# Patient Record
Sex: Male | Born: 1955 | Race: White | Hispanic: No | Marital: Married | Smoking: Current every day smoker
Health system: Southern US, Community
[De-identification: ages and names within clinical notes are randomized; demographics above are authoritative.]

## PROBLEM LIST (undated history)

## (undated) DIAGNOSIS — M4714 Other spondylosis with myelopathy, thoracic region: Secondary | ICD-10-CM

## (undated) DIAGNOSIS — IMO0002 Reserved for concepts with insufficient information to code with codable children: Secondary | ICD-10-CM

## (undated) DIAGNOSIS — J189 Pneumonia, unspecified organism: Secondary | ICD-10-CM

## (undated) DIAGNOSIS — K219 Gastro-esophageal reflux disease without esophagitis: Secondary | ICD-10-CM

## (undated) DIAGNOSIS — E119 Type 2 diabetes mellitus without complications: Secondary | ICD-10-CM

## (undated) DIAGNOSIS — G4730 Sleep apnea, unspecified: Secondary | ICD-10-CM

## (undated) DIAGNOSIS — G259 Extrapyramidal and movement disorder, unspecified: Secondary | ICD-10-CM

## (undated) DIAGNOSIS — R32 Unspecified urinary incontinence: Secondary | ICD-10-CM

## (undated) DIAGNOSIS — R011 Cardiac murmur, unspecified: Secondary | ICD-10-CM

## (undated) DIAGNOSIS — J449 Chronic obstructive pulmonary disease, unspecified: Secondary | ICD-10-CM

## (undated) DIAGNOSIS — E7800 Pure hypercholesterolemia, unspecified: Secondary | ICD-10-CM

## (undated) DIAGNOSIS — H5440 Blindness, one eye, unspecified eye: Secondary | ICD-10-CM

## (undated) DIAGNOSIS — R0602 Shortness of breath: Secondary | ICD-10-CM

## (undated) DIAGNOSIS — G4700 Insomnia, unspecified: Secondary | ICD-10-CM

## (undated) DIAGNOSIS — I10 Essential (primary) hypertension: Secondary | ICD-10-CM

## (undated) DIAGNOSIS — I509 Heart failure, unspecified: Secondary | ICD-10-CM

## (undated) HISTORY — DX: Reserved for concepts with insufficient information to code with codable children: IMO0002

## (undated) HISTORY — DX: Heart failure, unspecified: I50.9

## (undated) HISTORY — PX: CARDIAC CATHETERIZATION: SHX172

## (undated) HISTORY — PX: EYE SURGERY: SHX253

## (undated) HISTORY — DX: Insomnia, unspecified: G47.00

## (undated) HISTORY — DX: Morbid (severe) obesity due to excess calories: E66.01

## (undated) HISTORY — PX: CATARACT EXTRACTION, BILATERAL: SHX1313

## (undated) HISTORY — PX: OTHER SURGICAL HISTORY: SHX169

## (undated) HISTORY — DX: Chronic obstructive pulmonary disease, unspecified: J44.9

## (undated) HISTORY — DX: Extrapyramidal and movement disorder, unspecified: G25.9

## (undated) HISTORY — PX: CARPAL TUNNEL RELEASE: SHX101

## (undated) HISTORY — DX: Unspecified urinary incontinence: R32

## (undated) HISTORY — DX: Other spondylosis with myelopathy, thoracic region: M47.14

---

## 1997-10-09 ENCOUNTER — Ambulatory Visit (HOSPITAL_COMMUNITY): Admission: RE | Admit: 1997-10-09 | Discharge: 1997-10-10 | Payer: Self-pay | Admitting: Pediatrics

## 1997-11-20 ENCOUNTER — Inpatient Hospital Stay (HOSPITAL_COMMUNITY): Admission: RE | Admit: 1997-11-20 | Discharge: 1997-11-21 | Payer: Self-pay | Admitting: Pediatrics

## 1998-05-28 ENCOUNTER — Ambulatory Visit (HOSPITAL_COMMUNITY): Admission: RE | Admit: 1998-05-28 | Discharge: 1998-05-28 | Payer: Self-pay | Admitting: Neurosurgery

## 1998-06-01 ENCOUNTER — Ambulatory Visit (HOSPITAL_COMMUNITY): Admission: RE | Admit: 1998-06-01 | Discharge: 1998-06-02 | Payer: Self-pay | Admitting: Neurosurgery

## 1998-07-11 ENCOUNTER — Inpatient Hospital Stay (HOSPITAL_COMMUNITY): Admission: EM | Admit: 1998-07-11 | Discharge: 1998-07-11 | Payer: Self-pay | Admitting: Neurosurgery

## 1998-07-16 ENCOUNTER — Encounter: Payer: Self-pay | Admitting: Pediatrics

## 1998-07-16 ENCOUNTER — Inpatient Hospital Stay (HOSPITAL_COMMUNITY): Admission: RE | Admit: 1998-07-16 | Discharge: 1998-07-17 | Payer: Self-pay | Admitting: Neurosurgery

## 1998-09-01 ENCOUNTER — Inpatient Hospital Stay (HOSPITAL_COMMUNITY): Admission: AD | Admit: 1998-09-01 | Discharge: 1998-09-07 | Payer: Self-pay | Admitting: Neurosurgery

## 1999-05-17 ENCOUNTER — Ambulatory Visit (HOSPITAL_COMMUNITY): Admission: RE | Admit: 1999-05-17 | Discharge: 1999-05-17 | Payer: Self-pay | Admitting: Pediatrics

## 1999-05-31 ENCOUNTER — Encounter: Payer: Self-pay | Admitting: Pediatrics

## 1999-05-31 ENCOUNTER — Ambulatory Visit (HOSPITAL_COMMUNITY): Admission: RE | Admit: 1999-05-31 | Discharge: 1999-05-31 | Payer: Self-pay | Admitting: Pediatrics

## 2000-03-20 ENCOUNTER — Encounter: Payer: Self-pay | Admitting: Pediatrics

## 2000-03-20 ENCOUNTER — Encounter: Admission: RE | Admit: 2000-03-20 | Discharge: 2000-03-20 | Payer: Self-pay | Admitting: Pediatrics

## 2000-04-04 ENCOUNTER — Inpatient Hospital Stay (HOSPITAL_COMMUNITY): Admission: RE | Admit: 2000-04-04 | Discharge: 2000-04-06 | Payer: Self-pay | Admitting: Neurosurgery

## 2001-03-19 ENCOUNTER — Encounter: Admission: RE | Admit: 2001-03-19 | Discharge: 2001-03-19 | Payer: Self-pay | Admitting: Pediatrics

## 2001-03-19 ENCOUNTER — Encounter: Payer: Self-pay | Admitting: Pediatrics

## 2001-04-05 ENCOUNTER — Inpatient Hospital Stay (HOSPITAL_COMMUNITY): Admission: AD | Admit: 2001-04-05 | Discharge: 2001-04-07 | Payer: Self-pay | Admitting: Neurosurgery

## 2001-04-27 ENCOUNTER — Encounter: Admission: RE | Admit: 2001-04-27 | Discharge: 2001-04-27 | Payer: Self-pay | Admitting: Neurosurgery

## 2001-06-26 ENCOUNTER — Encounter: Admission: RE | Admit: 2001-06-26 | Discharge: 2001-06-26 | Payer: Self-pay | Admitting: Pediatrics

## 2001-06-26 ENCOUNTER — Encounter: Payer: Self-pay | Admitting: Pediatrics

## 2001-07-06 ENCOUNTER — Ambulatory Visit (HOSPITAL_COMMUNITY): Admission: RE | Admit: 2001-07-06 | Discharge: 2001-07-06 | Payer: Self-pay | Admitting: Neurosurgery

## 2002-01-16 ENCOUNTER — Encounter: Admission: RE | Admit: 2002-01-16 | Discharge: 2002-01-16 | Payer: Self-pay | Admitting: Pediatrics

## 2002-01-16 ENCOUNTER — Encounter: Payer: Self-pay | Admitting: Pediatrics

## 2002-12-19 ENCOUNTER — Encounter: Payer: Self-pay | Admitting: Pediatrics

## 2002-12-19 ENCOUNTER — Encounter: Admission: RE | Admit: 2002-12-19 | Discharge: 2002-12-19 | Payer: Self-pay | Admitting: Pediatrics

## 2003-02-26 ENCOUNTER — Emergency Department (HOSPITAL_COMMUNITY): Admission: EM | Admit: 2003-02-26 | Discharge: 2003-02-26 | Payer: Self-pay | Admitting: Emergency Medicine

## 2003-12-19 ENCOUNTER — Other Ambulatory Visit: Payer: Self-pay

## 2004-04-29 ENCOUNTER — Ambulatory Visit: Payer: Self-pay | Admitting: Internal Medicine

## 2004-05-28 ENCOUNTER — Emergency Department (HOSPITAL_COMMUNITY): Admission: EM | Admit: 2004-05-28 | Discharge: 2004-05-28 | Payer: Self-pay | Admitting: Emergency Medicine

## 2004-07-07 ENCOUNTER — Encounter: Admission: RE | Admit: 2004-07-07 | Discharge: 2004-07-07 | Payer: Self-pay | Admitting: Pediatrics

## 2004-08-04 ENCOUNTER — Encounter: Payer: Self-pay | Admitting: Pediatrics

## 2004-08-25 ENCOUNTER — Encounter: Payer: Self-pay | Admitting: Pediatrics

## 2004-09-30 ENCOUNTER — Ambulatory Visit: Payer: Self-pay | Admitting: Internal Medicine

## 2004-11-02 ENCOUNTER — Ambulatory Visit: Payer: Self-pay | Admitting: Specialist

## 2005-01-03 ENCOUNTER — Observation Stay (HOSPITAL_COMMUNITY): Admission: RE | Admit: 2005-01-03 | Discharge: 2005-01-03 | Payer: Self-pay | Admitting: Neurosurgery

## 2005-02-18 ENCOUNTER — Inpatient Hospital Stay (HOSPITAL_COMMUNITY): Admission: AD | Admit: 2005-02-18 | Discharge: 2005-02-19 | Payer: Self-pay | Admitting: Neurosurgery

## 2005-04-18 ENCOUNTER — Inpatient Hospital Stay (HOSPITAL_COMMUNITY): Admission: RE | Admit: 2005-04-18 | Discharge: 2005-04-18 | Payer: Self-pay | Admitting: Neurosurgery

## 2005-06-23 ENCOUNTER — Ambulatory Visit (HOSPITAL_COMMUNITY): Admission: RE | Admit: 2005-06-23 | Discharge: 2005-06-23 | Payer: Self-pay | Admitting: Orthopedic Surgery

## 2005-06-23 ENCOUNTER — Ambulatory Visit (HOSPITAL_BASED_OUTPATIENT_CLINIC_OR_DEPARTMENT_OTHER): Admission: RE | Admit: 2005-06-23 | Discharge: 2005-06-23 | Payer: Self-pay | Admitting: Orthopedic Surgery

## 2005-10-31 ENCOUNTER — Ambulatory Visit: Payer: Self-pay

## 2005-11-03 ENCOUNTER — Ambulatory Visit: Payer: Self-pay

## 2006-08-18 ENCOUNTER — Ambulatory Visit: Payer: Self-pay | Admitting: Internal Medicine

## 2007-08-29 ENCOUNTER — Ambulatory Visit: Payer: Self-pay | Admitting: Family Medicine

## 2008-02-27 ENCOUNTER — Observation Stay (HOSPITAL_COMMUNITY): Admission: RE | Admit: 2008-02-27 | Discharge: 2008-02-28 | Payer: Self-pay | Admitting: Neurosurgery

## 2008-05-15 ENCOUNTER — Ambulatory Visit: Payer: Self-pay | Admitting: Internal Medicine

## 2008-12-24 ENCOUNTER — Ambulatory Visit (HOSPITAL_COMMUNITY): Admission: RE | Admit: 2008-12-24 | Discharge: 2008-12-24 | Payer: Self-pay | Admitting: Pediatrics

## 2009-02-20 ENCOUNTER — Ambulatory Visit: Payer: Self-pay | Admitting: Internal Medicine

## 2010-01-19 ENCOUNTER — Ambulatory Visit: Payer: Self-pay | Admitting: Internal Medicine

## 2010-11-09 NOTE — Op Note (Signed)
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  He has had a baclofen pump placed, which helps quite a bit  when it is working well.  He has had multiple baclofen pump revisions.  The patient has become more spastic and developed a leg pain.  He, on  working with x-rays that demonstrated it looks like another baclofen  pump catheter fracture, and we discussed a revision of his baclofen  pump.  The patient also requests to have some old retained intrathecal  catheters removed as well.  I discussed the surgery with him, the risks,  benefits, and alternatives and he desired to proceed with the surgery.   PREOPERATIVE DIAGNOSIS:  Baclofen pump malfunction; retained intrathecal  catheters.   POSTOPERATIVE DIAGNOSIS:  Baclofen pump malfunction; retained  intrathecal catheters.   PROCEDURES:  Baclofen pump revision (replacement of intrathecal  catheter); L4 laminectomy and L3 laminotomies for removal of retained  intrathecal catheters using microdissection.   SURGEON:  Cristi Loron, MD   ASSISTANT:  Danae Orleans. Venetia Maxon, MD   ANESTHESIA:  General endotracheal.   ESTIMATED BLOOD LOSS:  100 mL.   SPECIMENS:  None.   DRAINS:  None.   COMPLICATIONS:  None.   SURGICAL PROCEDURE:  The patient was brought to the operating room by  the anesthesia team.  General endotracheal anesthesia was induced.  The  patient initially was turned on the lateral position with his right side  down.  His left abdomen, flank, and lumbar region was then shaved with  clippers and then prepared with Betadine scrub and  Betadine solution.  Sterile drapes were applied.  I then injected the area to be incised  with Marcaine with epinephrine solution.  I used a scalpel to make an  incision through the patient's previous left upper quadrant abdominal  incision over the baclofen pump.  We used electrocautery to expose the  baclofen pump.  There was a small seroma around pump.  No evidence of  infection.  I removed the pump from the subcutaneous pocket.  We then  used a 15 blade scalpel to carefully remove the silk ties from the  connector that connected the catheter down to the pump.  We then used a  syringe to aspirate through the intrathecal catheter.  We could not  express any CSF fluid indicative of malfunction.  We then removed the  catheter.   We now turned attention to placing a new intrathecal catheter.  I  incised the L2-3 interspace with a scalpel exposing the underlying  fascia.  I under fluoroscopic guidance cannulated the L2-3 interspace  with a Tuohy needle and removed the stylet and then threaded a  intrathecal catheter through the Tuohy needle.  We then removed the  guidewire and we had free spontaneous flow of cerebrospinal fluid  through the proximal end of the thecal catheter.  We then used a shunt  passer to pass the catheter from the lumbar incision into the abdominal  incision.  We then shortened the catheter by 2.5 cm.  We then connected  the fascia and connected the thecal catheter to the baclofen pump and  secured all the connections appropriately with the sutureless  connectors.  We then used a needle aspirate to the aspiration port  through the baclofen pump and we freely expressed 34 mL of cerebrospinal  fluid, indicative  pump's patency.  We placed the pump in the  subcutaneous pocket and then secured the sock to the subcutaneous  tissue using interrupted #1 Vicryl sutures.  We then reapproximated the  patient's abdominal incision with interrupted 2-0 Vicryl suture in   subcutaneous tissue, and Steri-Strips and Benzoin on the skin.  We then  placed the fastener in the lumbar incision and sutured to the fascia,  then reapproximated the patient's lumbar subcutaneous tissue with  interrupted 2-0 Vicryl suture and the skin with Steri-Strips and  Benzoin.   Having completed the revision of baclofen pump, we now turned attention  to the laminectomy to remove the retained intrathecal catheters.  The  patient was then turned to prone position on the [place] frame.  His  lumbar region was then prepared with Betadine scrub and Betadine  solution.  Sterile drapes were applied.  I injected the area to be  incised with Marcaine with epinephrine solution.  I used a scalpel to  make a linear midline incision over the L3-4 and L4-5 interspaces.  I  used electrocautery to perform a bilateral subperiosteal resection  exposing spinous process lamina of L3 and L4.  We obtained  intraoperative radiograph to confirm our location.  I then incised the  L3-4 and L4-5 interspinous ligament with the scalpel and then used  Leksell rongeur to remove the spinous process of L4 and caudal spinous  process of L3.  We then used a high-speed drill to perform bilateral L3-  L4 laminotomies.  I then completed the L4 laminectomy with a Kerrison  punch, widened the L3 laminotomies and removed the L3-4 and L4-5  ligamentum flavum exposing the thecal sac.  We then brought the  operative microscope into field and under speculation illumination, we  completed the microdissection/decompression.  I used a a 15 blade  scalpel to perform a midline durotomy.  We tacked the dural edges up  using 4-0 Nurolon sutures.  I then incised the underlying arachnoid  membrane and released CSF from the thecal sac.  We then used  microdissection to dissect in the cauda equina and we found the expected  4 intrathecal catheter which were easily removed.  We then irrigated the  subarachnoid space out with saline  solution and then reapproximated the  patient's dura with a running 6-0 Prolene suture.  We then had  anesthesia Valsalva with the patient.  There was a watertight seal, then  was placed Tisseel over the exposed dura to decrease chance of CSF  fistula.  We obtained hemostasis using bipolar cautery, irrigated the  wound out with bacitracin solution, removed the retractors, and then  reapproximated the patient's thoracolumbar fascia with interrupted #1  Vicryl suture.  The subcutaneous tissue with interrupted 2-0 Vicryl  suture and the skin with Steri-Strips and Benzoin.  The wound was then  coated with bacitracin ointment.  Sterile dressing was applied.  The  drapes were removed.  The patient was subsequently returned to supine  position where he was extubated by the anesthesia team and transported  to the Postanesthesia Care Unit in stable condition.  All sponge,  instrument, and needle counts were correct at the end of this case.      Cristi Loron, M.D.  Electronically Signed     JDJ/MEDQ  D:  02/27/2008  T:  02/28/2008  Job:  [id number]

## 2010-11-12 NOTE — Op Note (Signed)
[place]. [facility]  Patient:    [name], [name] Visit Number: [id number] MRN: [id number]          Service Type: Attending:  Cristi Loron, M.D. Dictated by:   Cristi Loron, M.D.   CC:         Deanna Artis. Sharene Skeans, M.D.   Operative Report  PREOPERATIVE DIAGNOSIS:  Possible Baclofen pump malfunction/fracture, fractured catheter.  POSTOPERATIVE DIAGNOSIS:  Functioning catheter.  PROCEDURE:  Aspiration of Baclofen pump tubing contents and injection of Omnipaque dye into the catheter and subsequently into the thecal sac.  SURGEON:  Cristi Loron, M.D.  ANESTHESIA:  Local.  BRIEF HISTORY:  The patient is a white male for whom I had placed a Baclofen pump for spastic paraparesis several years ago.  He has had a couple of fracture malfunctions/catheter fractures.  He felt increasing spasticity, and therefore we got some x-rays that demonstrated no evidence of catheter fracture, but in order to make sure that the catheter was indeed patent, the patient decided to proceed with a Baclofen pump aspiration injection.  DESCRIPTION OF PROCEDURE:  The patient was brought to the fluoroscopy suite. His abdomen was then prepared with Betadine solution.  Sterile drapes were applied.  I injected 1% lidocaine solution over the area of the Baclofen pump access port.  Under fluoroscopic guidance I used a 25-gauge spinal needle to access the lateral catheter access port.  I aspirated 10 cc of clear fluid and then injected approximately 3 cc of Omnipaque 180 under fluoroscopic guidance.  It demonstrated the dye flowing freely into the thecal sac, demonstrating the patency of the catheter.  The drapes were removed.  A bandage was placed over the injection site.  The patient was subsequently referred to the day hospital for observation. Dictated by:   Cristi Loron, M.D. Attending:  Cristi Loron, M.D. DD:  07/06/01 TD:  07/06/01 Job:  [id number]

## 2010-11-12 NOTE — Op Note (Signed)
NAMEHENNING, EHLE            ACCOUNT NO.:  [id number]   MEDICAL RECORD NO.:  [id number]          PATIENT TYPE:  INP   LOCATION:  2899                         FACILITY:  [facility]   PHYSICIAN:  Cristi Loron, M.D.DATE OF BIRTH:  [birth year]   DATE OF PROCEDURE:  04/18/2005  DATE OF DISCHARGE:                                 OPERATIVE REPORT   INDICATIONS FOR PROCEDURE:  The patient is a 55 year old white male who has  spastic paraparesis of unknown etiology.  He has had a baclofen pump placed,  with good relief of his symptoms.  He has had multiple revisions.  Most  recently, the patient has complained that the pump seems to be moving in his  abdomen, sliding down.  At this point, the pump is somewhat superficial.  We discussed the various treatment options, including observation versus  revision of the pump and moving it to the opposite side, i.e., the left  abdomen.  The patient weighed the risks, benefits, and alternatives of  surgery and has decided to proceed with the operation.   PREOPERATIVE DIAGNOSIS:  Baclofen pump movement.   POSTOPERATIVE DIAGNOSIS:  Baclofen pump movement.   PROCEDURE:  Revision of baclofen pump (moving the baclofen pump to the left  abdomen).   SURGEON:  Cristi Loron, M.D.   ASSISTANT:  None.   ANESTHESIA:  General endotracheal.   ESTIMATED BLOOD LOSS:  Minimal.   SPECIMENS:  Cultures of peripump fluid.   DRAINS:  None.   COMPLICATIONS:  None.   DESCRIPTION OF PROCEDURE:  The patient was brought to the operating room by  the anesthesia team.  General endotracheal anesthesia was induced.  The  patient remained in the supine position.  His abdomen was shaved and  prepared with Betadine scrub and Betadine solution.   In then injected the area to be incised with Marcaine with epinephrine  solution.  I used the scalpel to make an incision over the patient's  previous right abdomen scar over his pump.  I used electrocautery to  dissect  deeper.  I used the Barrister's clerk for exposure.  We exposed the pump.  There was some benign-appearing fluid around the pump which was yellow and  clear.  We obtained some cultures to be complete.  We then removed the  baclofen pump out of the abdominal pocket.  We disconnected the silk ties  which were securing the pump catheter to the pump and disconnected the  catheter.  There was good and spontaneous backflow of fluid through the pump  catheter.  We then made an incision in the patient's left abdomen just below  the ribs and used electrocautery to create a subcutaneous pocket.  We then  used a shunt passer to pass a 24-cm new piece of shunt tubing from the left  incision to the right incision.  We then used a connector to connect the old  catheter to the new catheter extension (the old catheter was 81 cm, the new  catheter was 24 cm, making the total length of 105 cm).  We secured the  connection with silk ties, and then we  used the TB syringe to withdraw 1 cc  of baclofen/cerebrospinal fluid from the catheter, and there continued to be  free and spontaneous flow of CSF backwards through the catheter.  We then  connected the catheter to the new pump connector and connected it to the  pump.  We used silk ties to secure the connections.  We placed a mesh  stocking over the baclofen pump and placed it in the new subcutaneous pocket  created in the left abdomen.  We then irrigated the wounds out and then  reapproximated the subcutaneous tissue with interrupted 2-0 Vicryl sutures  and the skin with Steri-Strips and benzoin.  The wounds were then coated  with bacitracin ointment.  Sterile dressings were applied.  The drapes were  removed.   The patient was subsequently extubated by the anesthesia team and  transported to the postanesthesia care unit in stable condition.  All  sponge, instrument, and needle counts were correct at the end of this case.   Prior to transporting  the patient to the postanesthesia care unit, the pump  was programmed to give a 462 mcg bolus of a 2000 concentration of baclofen  to flush out the 105 cm of pump tubing.      Cristi Loron, M.D.  Electronically Signed     JDJ/MEDQ  D:  04/18/2005  T:  04/18/2005  Job:  [id number]

## 2010-11-12 NOTE — Discharge Summary (Signed)
[place]. [facility]  Patient:    [name], [name] Visit Number: [id number] MRN: [id number]          Service Type: MED Location: 3000 [id number] [id number] Attending Physician:  Cristi Loron Dictated by:   Tanya Nones. Jeral Fruit, M.D. Admit Date:  04/04/2001 Discharge Date: 04/07/2001                             Discharge Summary  ADMISSION DIAGNOSIS:  Baclofen pump malfunction.  DISCHARGE DIAGNOSIS:  Baclofen pump malfunction.  CLINICAL HISTORY:  The patient was admitted by Dr. Lovell Sheehan because of malfunction of the baclofen pump.  The patient has a spastic paraparesis and he had been treated by the neurologist.  Because of malfunction of the shunt, he was admitted for revision.  LABORATORIES:  Normal.  COURSE IN THE HOSPITAL:  The patient was taken to surgery and had revision of the shunt done by Dr. Lovell Sheehan.  It was found that there was fracture of the catheter and a new catheter was replaced.  Today, the patient is feeling much better and he wants to go home.  CONDITION ON DISCHARGE:  Improved.  MEDICATION:  Percocet for pain.  DIET:  Regular.  ACTIVITY:  As before surgery.  FOLLOWUP:  He is to call Dr. Lovell Sheehan for a followup appointment.Dictated by: Charlton Haws. Jeral Fruit, M.D. Attending Physician:  Tressie Stalker D DD:  04/07/01 TD:  04/08/01 Job: [id number]

## 2010-11-12 NOTE — H&P (Signed)
[place]. [facility]  Patient:    [name], [name]                   MRN: [id number] Adm. Date:  [id number] Disc. Date: [id number] Attending:  Tressie Stalker [name]                         History and Physical  CHIEF COMPLAINT:  "Baclofen pump is broken."  HISTORY OF PRESENT ILLNESS:  The patient is a 55 year old white male who has a spastic paraparesis of unknown etiology and has been worked up by Dr. Deanna Artis. [name] for this.  He underwent insertion of a Baclofen pump by me with an intrathecal catheter, first on Nov 20, 1997.  He subsequently underwent a revision of his distal catheter in December of 1999 and suffered another fracture in January of 2000, requiring another revision.  He has done well since January 2000, until approximately three weeks ago, when he was golfing, he swung at the ball, missed, spun around and fell to the ground and since then, has had some increasing spasticity.  He saw Dr. Sharene [name] who worked him up with some x-rays that demonstrated that this intrathecal catheter had fractured and Dr. Sharene [name] kindly sent him back for my consultation.  I discussed the situation with him.  The patient weighed the risks, benefits and alternatives to surgery and decided to proceed with a Baclofen pump revision.  PAST MEDICAL HISTORY:  Past medical history is positive, as above, for spastic paraparesis of unknown etiology, glaucoma and placement of a Baclofen pump.  PAST SURGICAL HISTORY:  Status post insertion of Baclofen pump, Nov 20, 1997, revision of fracture catheter on June 01, 1998 and July 10, 1998.  MEDICATIONS PRIOR TO ADMISSION 1. Prevacid 30 mg p.o. q.[name]. 2. Vioxx 30 mg p.o. q.[name]. 3. Baclofen 60 mg p.o. q.[name]. 4. Timoptic-XE eye drops, one drop OS q.[name].  DRUG ALLERGIES:  DIAMOX.  FAMILY MEDICAL HISTORY:  Noncontributory.  SOCIAL HISTORY:  Patient smoke one-half pack per day of cigarettes.  He is married.  He is  working.  REVIEW OF SYSTEMS:  Negative except as above.  PHYSICAL EXAMINATION  GENERAL:  A pleasant 55 year old white male with a spastic paraparesis who walks with a cane.  HEENT:  Normocephalic.  He has anisocoria (longstanding).  Oropharynx benign.  NECK:  Supple.  No masses or deformities, meningismus, etc.  THORAX:  Symmetric.  LUNGS:  Clear to auscultation.  HEART:  Regular rate and rhythm.  ABDOMEN:  Soft and nontender.  His incision for his Baclofen pump has healed well.  The pump region is nontender.  BACK:  His lumbar incision has healed well.  There are no masses, discharge, infection, etc.  NEUROLOGIC:  The patient is alert and oriented x 3.  Cranial nerve exam is grossly normal except for his above-mentioned anisocoria.  He has some weakness, i.e., 4/5 in his right psoas and quadriceps; otherwise, his strength is relatively normal.  Sensation is grossly normal.  IMAGING STUDIES:  The patient had lumbar x-rays performed at [facility] on March 20, 2000 which demonstrates the patients Baclofen pump intrathecal catheter has fractured.  ASSESSMENT AND PLAN 1. Baclofen pump catheter fracture.  I discussed the situation with patient    and reviewed his x-rays with him and pointed out the abnormalities.  I    discussed the risks and treatment options including going back on oral    Baclofen and not doing  any more surgery versus a revision of his catheter.    He has been through this before.  I re-explained the procedure and its    risks.  The patient weighed the risks, benefits and alternatives to surgery    and decided to proceed with the operation. 2. History of glaucoma noted. DD:  04/03/00 TD:  04/03/00 Job: [id number]

## 2010-11-12 NOTE — Op Note (Signed)
[place]. [facility]  Patient:    [name], [name]                   MRN: [id number] Proc. Date: 04/03/00 Adm. Date:  [id number] Attending:  Cristi Loron CC:         Deanna Artis. Sharene Skeans, M.D.   Operative Report  BRIEF HISTORY:  The patient is a 55 year old white male who has a spastic paraparesis of unknown etiology.  He has been treated with a baclofen pump. He has had two prior catheter fractures and approximately 2-3 weeks fell and noticed some increasing spasticity after that and subsequently was diagnosed with another catheter fracture.  I discussed the various treatment options with him including surgery.  The patient weighed the risks, benefits and alternatives of surgery and decided to have his pump revised.  PREOPERATIVE DIAGNOSIS:  [employer] baclofen pump catheter fracture.  POSTOPERATIVE DIAGNOSIS:  [employer] baclofen pump catheter fracture.  PROCEDURE:  Revision of [employer] baclofen catheter (replacement of catheter).  SURGEON:  Cristi Loron, M.D.  ASSISTANT:  None.  ANESTHESIA:  General endotracheal anesthesia.  ESTIMATED BLOOD LOSS:  Minimal.  SPECIMENS:  None.  DRAINS:  None.  COMPLICATIONS:  None.  DESCRIPTION OF PROCEDURE:  The patient was brought to the operating room by the anesthesia team.  General endotracheal anesthesia was induced.  He was then turned onto his left side with his right flank up.  His right abdomen flank and back was then shaved and prepared with Betadine scrub and Betadine solution and sterile drapes were applied.  I then injected the areas to be incised with Marcaine with epinephrine solution.  I used a scalpel to make a midline incision in the lower lumbar region i.e. through his previous surgical site.  I used electrocautery to dissect down to the old catheter.  I removed as much of the catheter as I could (prior to the catheter had fractured and was within the spinal canal and was not  readily accessible and I had previously discussed with IM leaving this catheter in place and I did this, I left it in place).  I then put a pursestring suture around the tract of the old catheter.  There was no spinal fluid leaking.  I then used a Tuohy needle to cannulate the patients thecal sac at the L4-5 interspace.  I removed the stylet and there was good free flow of cerebrospinal fluid.  I then passed a new intrathecal catheter through the Tuohy needle and then under fluoroscopic guidance up to the T10-11 intervertebral disk.  I then removed the Tuohy needle over the catheter and then removed the guidewire from the catheter.  I then turned my attention to the abdominal wound.  I incised through the previous abdominal incision and then used electrocautery to expose where the old catheter connected with the baclofen pump.  I used the scalpel to remove the silk tie sutures which were securing the old catheter in place and then I removed the old catheter and the connector.  I then used a shunt passer to pass the new intrathecal catheter from the lumbar incision to the abdominal incision.  I then shortened the new catheter and connected it to the baclofen pump via the connector and two, 0 Silk ties.  I left a little extra catheter in place.  I left some slack in the system.  I then copiously irrigated the abdominal wound with bacitracin solution, removed solution, and then reapproximated the patients abdominal  subcutaneous tissue with interrupted 2-0 Vicryl and the skin with Steri-Strips and benzoin.  The wounds were then covered with bacitracin ointment.  I then placed a connector at the entrance site of the thecal catheter into the spine and secured it with 2-0 Vicryl sutures.  I then irrigated the lumbar incision with bacitracin solution and then reapproximated the subcutaneous tissue in this area with interrupted 2-0 Vicryl suture and the skin with a running 3-0 nylon suture.  Of  note, throughout the procedure there was good free flow of cerebrospinal fluid through the distal end of the intrathecal catheter obviously up to the point where it I connected it to the pump.  All sponge, needle and instrument counts were correct at the end of this case. A sterile dressing was placed on both incisions and drapes were removed and patient was subsequently returned to the supine position and was extubated by the anesthesia team and transported to the post anesthesia care unit in stable condition.  We did not bolus the pump.  We kept it running at the patients normal basal rate. DD:  04/03/00 TD:  04/04/00 Job: [id number]

## 2010-11-12 NOTE — Op Note (Signed)
[name], [name]            ACCOUNT NO.:  [id number]   MEDICAL RECORD NO.:  [id number]          PATIENT TYPE:  AMB   LOCATION:  DSC                          FACILITY:  [facility]   PHYSICIAN:  Katy Fitch. Sypher, M.D. DATE OF BIRTH:  01/27/1956   DATE OF PROCEDURE:  06/23/2005  DATE OF DISCHARGE:                                 OPERATIVE REPORT   PREOPERATIVE DIAGNOSIS:  Chronic left carpal tunnel syndrome.   POSTOPERATIVE DIAGNOSIS:  Chronic left carpal tunnel syndrome.   OPERATIONS:  Release of left transverse carpal ligament.   OPERATIONS:  Katy Fitch. Sypher, M.D.   ASSISTANT:  Molly Maduro Dasnoit PA-C.   ANESTHESIA:  General by LMA, supervising anesthesiologist is Dr. Gelene Mink.   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  He has had a chronic degenerative  neurologic illness for 25 years.  He ambulates with a cane and other  assistive devices.  He has had a number of pain management strategies  utilized by his neurologist as well as a baclofen pump.  Recently he  developed increasing pain and numbness in his hands.  He was diagnosed by  Dr. Sharene Skeans to have carpal tunnel syndrome.  Electrodiagnostic studies  revealed moderately severe right carpal tunnel syndrome.   Due to a failure to respond to nonoperative measures, he is brought to the  operating at this time for release of his left transverse carpal ligament.   PROCEDURE:  [name] was brought to the operating room and placed in  supine position on the operating table.   Following the induction of general anesthesia by LMA technique, the left arm  was prepped with Betadine soap and solution and sterilely draped.   Following exsanguination of the limb with an Esmarch bandage, the arterial  tourniquet was inflated to 220 mmHg.   The procedure commenced with a short incision in the line of the ring finger  in the palm.  The subcutaneous tissues  were carefully divided to reveal the  palmar fascia.  This was split longitudinally to reveal the common sensory  branch of the median nerve.   These were followed back to the transverse carpal ligament, which was gently  isolated from median nerve.   The ligament released along its ulnar border extending into the distal  forearm.   This widely opened the carpal canal.  No mass or other predicaments were  noted.   Bleeding points along the margin of this ligament were cauterized with  bipolar current, followed by repair of the skin with intradermal 3-0 Prolene  suture.   A compressive dressing was applied with a volar plaster splint maintaining  the wrist in 5 degrees of dorsiflexion.   For aftercare [name] was given a prescription for Percocet 5/325 mg  one p.o. q.4-6h. p.r.n. pain, 20 tablets without refill.   Katy Fitch Sypher, M.D.  Electronically Signed     RVS/MEDQ  D:  06/23/2005  T:  06/24/2005  Job:  [id number]   cc:   Deanna Artis. Sharene Skeans, M.D.  Fax: [phone]

## 2010-11-12 NOTE — Op Note (Signed)
[name], [name]            ACCOUNT NO.:  [id number]   MEDICAL RECORD NO.:  [id number]          PATIENT TYPE:  INP   LOCATION:  3010                         FACILITY:  [facility]   PHYSICIAN:  [name], M.D.DATE OF BIRTH:  1956-04-19   DATE OF PROCEDURE:  02/18/2005  DATE OF DISCHARGE:  02/19/2005                                 OPERATIVE REPORT   BRIEF HISTORY:  The patient is a 55 year old white male who I performed a  revision of his baclofen pump secondary to a [employer] pump recall back on  January 03, 2005.  The patient did well postoperatively.  In fact, I saw him on  August 22 in a routine postoperative followup.  At that time he was doing  great, was having no trouble with his pump.  He suddenly took a fall and  said he felt the pump shift.  He manually pushed the shunt back up to what  he thought was a normal position and since then has had increasing  spasticity, he developed a headache and noticed some fluid around the shunt.  I saw the patient in the office today and evaluated with some shunt x-rays  and there was a question of a disconnection of the shunt.  I discussed the  various treatment options with the patient and his wife and recommended that  he undergo an exploration of his pump connections and the possible baclofen  pump revision.  The patient and his wife weighed the risks, benefits and  alternatives of surgery and decided to proceed with the operation.   PREOPERATIVE DIAGNOSIS:  Baclofen pump malfunction/disconnection.   POSTOPERATIVE DIAGNOSIS:  The baclofen pump is working fine and all  connections were secure.   PROCEDURE:  Exploration and testing of baclofen pump.   SURGEON:  Dr. Delma [name].   ASSISTANT:  None.   ANESTHESIA:  General endotracheal.   ESTIMATED BLOOD LOSS:  Minimal.   SPECIMENS:  None.   DRAINS:  None.   COMPLICATIONS:  None.   DESCRIPTION OF PROCEDURE:  The patient was brought to the operating room by  the  anesthesia team.  General endotracheal anesthesia was induced.  The  patient was turned to the lateral position with the right side up, exposing  his right abdomen.  His right abdomen, flank and his lumbar region was then  prepared with Betadine scrub and Betadine solution, sterile drapes were  applied.  I then used the scalpel to incise the patient's right lower  quadrant abdominal incision.  I used electrocautery to expose the caudal  aspect of the pump as well as the connection of the pump to the thecal  catheter.  I inspected the connections, they were all secure.  Because there  was a question of the functioning of the pump, I cut the silk tie suture  which was holding the connector to the pump and then carefully noted that  there was free and spontaneous flow of baclofen back through the catheter.  I then used a syringe to aspirate approximately 8 mL of baclofen followed by  cerebral spinal fluid, demonstrating the patency of the thecal  catheter.  We  then reconnected the connector to the pump and then secured it with a 0 silk  tie.  The wound was then copiously irrigated out with bacitracin solution.  Then the subcutaneous tissue was then reapproximated with a 2-0 Vicryl  suture and the skin was Steri-stripped with benzoin.  The wound was then  coated with bacitracin ointment, a sterile dressing was applied, the drapes  were removed and the patient was subsequently extubated by the anesthesia  team and transported to the Post Anesthesia Care Unit in stable condition.  All sponge, instrument and needle counts were correct at the end of this  case.   After the operation, we used the computer telemetry to query the pump.  It  seemed to be functioning well at its intended settings.  Because we had  flushed all of the baclofen out of the thecal catheter, we programmed the  pump to give a 340 mg, i.e. 0.17 mL bolus of baclofen to flush the cerebral  spinal fluid out and then we otherwise  kept the pump at its original  settings.   The patient tolerated this procedure well and was transported to the Post  Anesthesia Care Unit in stable condition.      [name], M.D.  Electronically Signed     JDJ/MEDQ  D:  02/18/2005  T:  02/20/2005  Job:  [id number]

## 2010-11-12 NOTE — Discharge Summary (Signed)
NAMECAILLOU, MINUS            ACCOUNT NO.:  [id number]   MEDICAL RECORD NO.:  [id number]          PATIENT TYPE:  AMB   LOCATION:  DSC                          FACILITY:  [facility]   PHYSICIAN:  Cristi Loron, M.D.DATE OF BIRTH:  10-24-1955   DATE OF ADMISSION:  06/23/2005  DATE OF DISCHARGE:  06/23/2005                                 DISCHARGE SUMMARY   BRIEF HISTORY:  The patient is a 55 year old white male who has spastic  paresis of unknown etiology,  he has had a baclofen pump place with relief  of symptoms.  He has had multiple revisions.  Most recently the patient  complains the pump seems to be moving around in his abdomen.  At this point  the pump seems to be somewhat superficial.  We discussed the various  treatment options including observation of the pump versus revision i.e.  moving it to the opposite side.  The patient's weighed the risks benefits  and alternatives to surgery, and decided to proceed with the operation.   For further details of this admission please refer to the typed history and  physical.   HOSPITAL COURSE:  On the day of admission I performed a revision of the  patient's baclofen pump.  I moved the pump from the right advanced to the  right abdomen.  The surgery went well.  For full details of this operation  please refer to the type operative note.   Postoperative Hospital Course:  The patient's postoperative course was  unremarkable.  The patient was discharged on the evening of surgery.  At  that he was afebrile, vital signs were stable, he was eating well and  ambulating well.  The patient was therefore discharged to home.   DISCHARGE DIAGNOSIS:  Baclofen pump, superficial and mobile.   OPERATION:  Revision of baclofen pump.      Cristi Loron, M.D.  Electronically Signed     JDJ/MEDQ  D:  06/30/2005  T:  07/01/2005  Job:  [id number]

## 2010-11-12 NOTE — Op Note (Signed)
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  He has undergone  placement of a Baclofen pump with several revisions of the catheter.  He has  done well for the last couple years, and the patient's Baclofen pump has  been recalled, and he has decided to have the pump replaced after weighing  the risks, benefits, and alternatives of surgery.   PREOPERATIVE DIAGNOSIS:  Baclofen pump recall.   POSTOPERATIVE DIAGNOSIS:  Baclofen pump recall.   PROCEDURE:  Replacement of Baclofen pump.   SURGEON:  Cristi Loron, M.D.   ASSISTANT:  None.   ANESTHESIA:  General endotracheal.   ESTIMATED BLOOD LOSS:  Minimal.   SPECIMENS:  None.   DRAINS:  None.   COMPLICATIONS:  None.   DESCRIPTION OF PROCEDURE:  The patient was brought to the operating room by  the anesthesia team.  General endotracheal anesthesia was induced.  The  patient was placed in the lateral position on an axillary roll with his  right side up.  His abdominal and thoracic region was then prepared with  Betadine scrub and Betadine solution.  Sterile drapes were applied.  I then  used a scalpel to make a linear incision in the patient's right lower  quadrant of his abdomen just below the Baclofen pump.  I used electrocautery  to expose the thecal catheter.  We then used electrocautery to expose the  Baclofen pump that we removed.  We externalized it.  We then prefilled the  new pump with 200 mcg/cc concentrated Baclofen, and then we purged the  system so that the Baclofen was  dripping from the end of the pump.  We then  disconnected the old suture tie which was holding the thecal catheter to the  old pump and then disconnected it.  We carefully observed that the fluid was  emanating from the catheter, indicative of a patent catheter.  We did not  let any of the medicine drip out.  We then connected the old catheter to the  new pump and then secured it with a 0 silk tie.  We then placed the new pump  back in the old sock which had previously contained the old pump.  We then  obtained stringent hemostasis using the bipolar cautery.  We then  reapproximated the patient's subcutaneous tissue with interrupted #1 Vicryl  suture, the skin with interrupted 2-0  Vicryl suture, and the superficial  skin with Steri-Strips and Benzoin.  The wound was then coated with  bacitracin ointment.  A sterile  dressing was applied.  The drapes were removed, and the patient was  subsequently extubated by the anesthesia team and transported  to the  postanesthesia care unit in stable condition.  All sponge, instrument, and  needle counts were correct at the end of this case.      Cristi Loron, M.D.  Electronically Signed     JDJ/MEDQ  D:  01/03/2005  T:  01/04/2005  Job:  [id number]

## 2010-11-12 NOTE — Op Note (Signed)
[place]  Patient:    [name], [name] Visit Number: [id number] MRN: [id number]          Service Type: DSU Location: 3000 3030 01 Attending Physician:  Cristi Loron Dictated by:   Cristi Loron, M.D. Proc. Date: 04/04/01 Admit Date:  04/04/2001                             Operative Report  PREOPERATIVE DIAGNOSIS:  Baclofen pump malfunction (catheter fracture).  POSTOPERATIVE DIAGNOSIS:  Baclofen pump malfunction (catheter fracture).  PROCEDURE:   ______ pump revision (placement of new intrathecal catheter, 31.5 cm long).  SURGEON:  Cristi Loron, M.D.  ASSISTANT:  None.  ANESTHESIA:  General endotracheal.  ESTIMATED BLOOD LOSS:  Less than 100 cc.  SPECIMENS:  None.  DRAINS:  None.  COMPLICATIONS:  None.  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  He had a back pump put in a couple of years ago.  He has done very well with it.  Presents with catheter fractures and began having increasing spasticity with headaches a couple weeks ago, and was found to have another catheter fracture.  I discussed the treatment options including doing nothing, medical management, and Baclofen pump revision.  The patient weighed the risks, benefits, and alternatives of surgery, and decided to have the pump revised.  DESCRIPTION OF PROCEDURE:  The patient was brought to the operating room by the anesthesia team.  General endotracheal anesthesia was induced.  The patient was then turned to the lateral position with the right side up, and his right abdomen, flank, and lumbar region was then shaved, prepped, and scrubbed with Betadine solution.  Sterile drapes were applied.  I injected the areas to be incised with Marcaine with epinephrine solution.  I made a paramedian incision at L4-5 on the right and used electrocautery to dissect down to the old catheter.  I then  inserted a Tuohy needle into the L4-5 interspace under fluoroscopic guidance.  I got good ________.  I then attempted to place the catheter.  It would not thread and I had to pull the catheter bag out and replace the Tuohy needle under fluoroscopic guidance and ________ and then the catheter threaded fairly easily.  I put the tip of the catheter at approximately T10-11 under fluoroscopic guidance, and then removed the guidewire from the catheter, and removed the Tuohy needle.  There was good flow of CSF through the catheter.  I then made an incision over the patients Baclofen pump in the right abdominal region.  I used electrocautery to expose the connection of the old catheter to the Baclofen pump.  I used the 15 blade scalpel to cut the silk ties and then I removed the fastener and the old catheter.  I then used a shunt passer to pass the intrathecal catheter from the lumbar incision to the abdominal incision.  I then cut off 3.5 cm of catheter and used the appropriate connectors to connect the catheter to the back of the pump and secure the connections with 0 silk ties.  I then placed the right angle fastener in the patients lumbar region to fasten the catheter down.  Again, I secured the silk ties and secured the fastener down to the fascia using Vicryl suture.  I should note that we checked the patency of the catheter right before I connected it  to the pump and there was good flow of CSF distally through the catheter.  I then copiously irrigated the wound with Bacitracin solution and achieved strenuous hemostasis with electrocautery.  I then reapproximated the patients subcutaneous tissue with interrupted 2-0 Vicryl suture.  I reapproximated the skin over the patients lumbar incision with a running 3-0 nylon suture, and the skin at the abdominal incision with Steri-Strips and Benzoin.  The wounds were then coated with Bacitracin ointment.  A sterile dressing applied and drapes  removed.  The patient was subsequently returned to the supine position where he was extubated by the anesthesia team and transferred to the postanesthesia care unit in stable condition.  All sponge, instrument, and needle counts were correct at the end of the case. Dictated by:   Cristi Loron, M.D. Attending Physician:  Tressie Stalker D DD:  04/04/01 TD:  04/04/01 Job: [id number]

## 2010-11-12 NOTE — Discharge Summary (Signed)
[place]  Patient:    [name], [name]                   MRN: [id number] Adm. Date:  [id number] Disc. Date: [id number] Attending:  Cristi Loron CC:         Deanna Artis. Sharene Skeans, M.D.   Discharge Summary  For full details of this admission, please refer to the typed history and physical.  BRIEF HISTORY:  The patient is a 55 year old white male who has a spastic paraparesis of unknown etiology.  He has been treated with Baclofen pump.  He has had two prior catheter fractures.  Approximately three weeks prior to this admission, he fell and noticed some increasing spasticity.  Subsequently he was diagnosed by Deanna Artis. Hickling, M.D., with another catheter fracture.  I discussed the various treatment options with him, including surgery.  The patient weighed the risks, benefits, and alternatives of surgery and decided to proceed with a pump revision.  For past medical history, past surgical history, medications prior to admission, drug allergies, family medical history, social history, admission history and physical exam, imaging studies, assessment and plan, etc., please refer to the typed history and physical.  HOSPITAL COURSE:  I performed a revision of a [employer] Baclofen pump catheter (replacement of the catheter) on April 03, 2000, without complications.  For full details of this operation, please refer to the typed operative note.  The patients postoperative course was essentially unremarkable.  I kept him flat for 24 hours to reduce the chance of developing a pseudomeningocele.  He did have some leg pain after the surgery, but it resolved.  By postoperative day #2, i.e., April 06, 2000, the patient was afebrile, the vital signs were stable, he was eating well, and ambulating well.  His wound was healing well without signs of infection.  He was requesting discharge home.  I therefore discharged him home on April 06, 2000.  DISCHARGE INSTRUCTIONS:  The patient was instructed to follow up with me in one week for suture removal.  He was given written discharge instructions.  DISCHARGE MEDICATIONS: 1. Percocet 5 mg #60 one to two p.o. q.4h. p.r.n. for pain, no refills. 2. Valium 5 mg #40 one p.o. q.6h. p.r.n. for muscle spasm, one refill.  FINAL DIAGNOSIS:  Baclofen pump malfunction (thecal catheter fracture).  PROCEDURES PERFORMED:  Revision of Baclofen pump (catheter replacement). DD:  04/27/00 TD:  04/27/00 Job: [id number]

## 2010-12-30 ENCOUNTER — Ambulatory Visit: Payer: Self-pay

## 2011-07-07 ENCOUNTER — Institutional Professional Consult (permissible substitution): Payer: Self-pay | Admitting: Cardiology

## 2011-07-27 ENCOUNTER — Institutional Professional Consult (permissible substitution): Payer: Self-pay | Admitting: Cardiology

## 2011-08-04 ENCOUNTER — Ambulatory Visit (INDEPENDENT_AMBULATORY_CARE_PROVIDER_SITE_OTHER): Payer: Managed Care, Other (non HMO) | Admitting: Cardiology

## 2011-08-04 ENCOUNTER — Encounter: Payer: Self-pay | Admitting: Cardiology

## 2011-08-04 ENCOUNTER — Encounter: Payer: Self-pay | Admitting: *Deleted

## 2011-08-04 DIAGNOSIS — I509 Heart failure, unspecified: Secondary | ICD-10-CM

## 2011-08-04 DIAGNOSIS — Z720 Tobacco use: Secondary | ICD-10-CM

## 2011-08-04 DIAGNOSIS — Z01810 Encounter for preprocedural cardiovascular examination: Secondary | ICD-10-CM

## 2011-08-04 DIAGNOSIS — I5030 Unspecified diastolic (congestive) heart failure: Secondary | ICD-10-CM

## 2011-08-04 DIAGNOSIS — F17200 Nicotine dependence, unspecified, uncomplicated: Secondary | ICD-10-CM

## 2011-08-04 NOTE — Assessment & Plan Note (Signed)
He says he quit smoking this morning. I encourage continued abstinence and applauded his effort.

## 2011-08-04 NOTE — Assessment & Plan Note (Signed)
I would like to review the data from [facility] prior to make a comment on his preoperative risk. However, I suspect he has some diastolic dysfunction and would likely be at acceptable risk for the planned procedure. We will try to get information from [facility] as quickly as possible.

## 2011-08-04 NOTE — Assessment & Plan Note (Signed)
I will check an echocardiogram and a BNP level. We discussed salt and fluid restriction.

## 2011-08-04 NOTE — Progress Notes (Signed)
HPI The patient presents for preoperative evaluation prior to getting a baclofen pump replace. He has this for treatment of spastic paralysis. Prior to this surgery he was sent here because of a past history of heart failure. He said he had dyspnea and was referred to [facility]. There he had a heart catheterization last year which she reports was normal. I don't have these results. This was apparently a radial catheterization and I don't suspect there were right heart pressures. He does report he had an echocardiogram and was told that his heart was "stiff". He was never told that he had a weak heart. No change in therapy or further evaluation was suggested. He does have chronic dyspnea. However, he has long-standing cigarette abuse and because of his chronic neurologic problems is very sedentary. With transfer from his wheelchair to his car he does get short of breath and tachycardic. He'll get dyspneic with this and some chest tightness. However, this is unchanged over the past many months since the time of his evaluation at [facility]. He denies any PND or orthopnea. He denies any palpitations, presyncope or syncope. He has chronic left lower extremity edema with a recent diagnosis of venous stasis.  Allergies  Allergen Reactions  . Diamox (Acetazolamide) Rash    Current Outpatient Prescriptions  Medication Sig Dispense Refill  . BACLOFEN IT Continuous intrathecal infusion pump      . DULoxetine (CYMBALTA) 60 MG capsule Take 60 mg by mouth daily.      . Fluticasone-Salmeterol (ADVAIR) 250-50 MCG/DOSE AEPB Inhale 1 puff into the lungs 2 (two) times daily.      . furosemide (LASIX) 20 MG tablet Take 20 mg by mouth daily.      Marland Kitchen gemfibrozil (LOPID) 600 MG tablet Take 600 mg by mouth daily.      Marland Kitchen lisinopril-hydrochlorothiazide (PRINZIDE,ZESTORETIC) 20-25 MG per tablet Take 1 tablet by mouth daily.      Marland Kitchen LORazepam (ATIVAN) 2 MG tablet Take 2 mg by mouth every 8 (eight) hours as needed.      . multivitamin  (THERAGRAN) per tablet Take 2 tablets by mouth daily.      Marland Kitchen oxyCODONE-acetaminophen (PERCOCET) 10-325 MG per tablet Take 1 tablet by mouth every 8 (eight) hours as needed.      Marland Kitchen rOPINIRole (REQUIP) 1 MG tablet Take 1 mg by mouth daily.      Marland Kitchen tiotropium (SPIRIVA) 18 MCG inhalation capsule Place 18 mcg into inhaler and inhale daily.        Past Medical History  Diagnosis Date  . CHF (congestive heart failure)   . COPD (chronic obstructive pulmonary disease)   . Thoracic myelopathy   . Incontinence   . Insomnia   . Morbid obesity   . Spastic paraparesis     Past Surgical History  Procedure Date  . Baclofen infusion pump   . Eye surgery     Family History  Problem Relation Age of Onset  . Cancer Father 30  . Emphysema Mother 69    History   Social History  . Marital Status: Married    Spouse Name: N/A    Number of Children: N/A  . Years of Education: N/A   Occupational History  . Not on file.   Social History Main Topics  . Smoking status: Current Everyday Smoker -- 4.0 packs/day for 38 years    Types: Cigarettes  . Smokeless tobacco: Not on file   Comment: Quit recent.  . Alcohol Use: Not on file  .  Drug Use: Not on file  . Sexually Active: Not on file   Other Topics Concern  . Not on file   Social History Narrative   Lives with wife.  No children.    ROS:  Positive for reflux, urinary frequency and nocturia, joint pains and swelling, constipation, reflux.  Otherwise as stated in the HPI and negative for all other systems.  PHYSICAL EXAM BP 140/79  Pulse 78  Ht 5\' 7"  (1.702 m)  Wt 255 lb (115.667 kg)  BMI 39.94 kg/m2 PHYSICAL EXAM GEN:  No distress NECK:  No jugular venous distention at 90 degrees, waveform within normal limits, carotid upstroke brisk and symmetric, no bruits, no thyromegaly, edentulous LYMPHATICS:  No cervical adenopathy LUNGS:  Clear to auscultation bilaterally BACK:  No CVA tenderness CHEST:  Unremarkable HEART:  S1 and S2  within normal limits, no S3, no S4, no clicks, no rubs, no murmurs ABD:  Positive bowel sounds normal in frequency in pitch, no bruits, no rebound, no guarding, unable to assess midline mass or bruit with the patient seated, obese EXT:  2 plus pulses throughout, moderate edema in the left leg, no cyanosis no clubbing SKIN:  No rashes no nodules NEURO:  Cranial nerves II through XII grossly intact, lower extremity weakness and muscle wasting PSYCH:  Cognitively intact, oriented to person place and time  EKG:  Sinus rhythm, rate 78, left axis deviation, left anterior fascicular block, poor anterior R wave progression.  08/04/2011  ASSESSMENT AND PLAN

## 2011-08-04 NOTE — Assessment & Plan Note (Signed)
Weight loss of difficult with his neurologic problems. He needs calorie restriction.

## 2011-08-04 NOTE — Patient Instructions (Signed)
Please have blood work today. (BNP)  The current medical regimen is effective;  continue present plan and medications.  Your physician has requested that you have an echocardiogram. Echocardiography is a painless test that uses sound waves to create images of your heart. It provides your doctor with information about the size and shape of your heart and how well your heart's chambers and valves are working. This procedure takes approximately one hour. There are no restrictions for this procedure.  Follow up as needed after testing

## 2011-08-05 ENCOUNTER — Telehealth: Payer: Self-pay | Admitting: Cardiology

## 2011-08-05 LAB — BRAIN NATRIURETIC PEPTIDE: Pro B Natriuretic peptide (BNP): 18 pg/mL (ref 0.0–100.0)

## 2011-08-05 NOTE — Telephone Encounter (Signed)
Error

## 2011-08-08 ENCOUNTER — Telehealth: Payer: Self-pay | Admitting: Cardiology

## 2011-08-08 NOTE — Telephone Encounter (Signed)
ROI faxed to [facility] @ [phone] 08/08/11/km

## 2011-08-09 NOTE — Telephone Encounter (Signed)
ROI Refaxed to [facility]  08/09/11/KM

## 2011-08-12 ENCOUNTER — Encounter: Payer: Self-pay | Admitting: Cardiology

## 2011-08-12 NOTE — Telephone Encounter (Signed)
I have reviewed this gentleman's records from [place]. He had cardiac catheterization in August. He had insignificant coronary disease. There was some mild pulmonary hypertension. He did have scattered 20% and 30% lesions in his coronaries. According to ACC/AHA guidelines the patient is at acceptable risk for the planned procedure.

## 2011-08-12 NOTE — Telephone Encounter (Signed)
Records rec from [facility] gave to [facility] 08/12/11/KM

## 2011-08-19 ENCOUNTER — Other Ambulatory Visit: Payer: Self-pay | Admitting: Neurosurgery

## 2011-08-25 ENCOUNTER — Ambulatory Visit (HOSPITAL_COMMUNITY): Payer: Managed Care, Other (non HMO)

## 2011-08-25 ENCOUNTER — Ambulatory Visit (HOSPITAL_COMMUNITY): Payer: Managed Care, Other (non HMO) | Attending: Cardiology

## 2011-08-25 ENCOUNTER — Other Ambulatory Visit: Payer: Self-pay

## 2011-08-25 DIAGNOSIS — J4489 Other specified chronic obstructive pulmonary disease: Secondary | ICD-10-CM | POA: Insufficient documentation

## 2011-08-25 DIAGNOSIS — I509 Heart failure, unspecified: Secondary | ICD-10-CM

## 2011-08-25 DIAGNOSIS — R0609 Other forms of dyspnea: Secondary | ICD-10-CM | POA: Insufficient documentation

## 2011-08-25 DIAGNOSIS — R0989 Other specified symptoms and signs involving the circulatory and respiratory systems: Secondary | ICD-10-CM | POA: Insufficient documentation

## 2011-08-25 DIAGNOSIS — J449 Chronic obstructive pulmonary disease, unspecified: Secondary | ICD-10-CM | POA: Insufficient documentation

## 2011-08-27 NOTE — Pre-Procedure Instructions (Signed)
[name]    08/27/2011    Your procedure is scheduled on:  Thursday, March 7th   Report to [facility] at  10:00 AM. (Per the office)   Call this number if you have problems the morning of surgery: [phone]   Remember:   Do not eat food:After Midnight Wednesday.   May have clear liquids: up to 4 Hours before arrival time --- 6:00AM.  Clear liquids include soda, tea, black coffee, apple or grape juice, broth.   Take these medicines the morning of surgery with A SIP OF WATER: Inhalers, Ativan,              Requip, Percocet   Do not wear jewelry, make-up or nail polish.   Do not wear lotions, powders, or perfumes. You may wear deodorant.   Do not shave 48 hours prior to surgery.  Do not bring valuables to the hospital.   Contacts, dentures or bridgework may not be worn into surgery.  Leave suitcase in the car. After surgery it may be brought to your room.  For patients admitted to the hospital, checkout time is 11:00 AM the day of discharge.   Patients discharged the day of surgery will not be allowed to drive home.  Name and phone number of your driver:  [name] -- spouse   Special Instructions: CHG Shower Use Special Wash: 1/2 bottle night before surgery and 1/2 bottle morning of surgery.   Please read over the following fact sheets that you were given: Pain Booklet, MRSA Information and Surgical Site Infection Prevention

## 2011-08-29 ENCOUNTER — Encounter (HOSPITAL_COMMUNITY)
Admission: RE | Admit: 2011-08-29 | Discharge: 2011-08-29 | Disposition: A | Discharge status: Home / Self Care | Payer: Managed Care, Other (non HMO) | Source: Ambulatory Visit | Attending: Anesthesiology | Admitting: Anesthesiology

## 2011-08-29 ENCOUNTER — Encounter (HOSPITAL_COMMUNITY)
Admission: RE | Admit: 2011-08-29 | Discharge: 2011-08-29 | Disposition: A | Discharge status: Home / Self Care | Payer: Managed Care, Other (non HMO) | Source: Ambulatory Visit | Attending: Neurosurgery | Admitting: Neurosurgery

## 2011-08-29 ENCOUNTER — Encounter (HOSPITAL_COMMUNITY): Payer: Self-pay

## 2011-08-29 HISTORY — DX: Cardiac murmur, unspecified: R01.1

## 2011-08-29 HISTORY — DX: Gastro-esophageal reflux disease without esophagitis: K21.9

## 2011-08-29 HISTORY — DX: Shortness of breath: R06.02

## 2011-08-29 HISTORY — DX: Essential (primary) hypertension: I10

## 2011-08-29 HISTORY — DX: Sleep apnea, unspecified: G47.30

## 2011-08-29 LAB — BASIC METABOLIC PANEL
BUN: 13 mg/dL (ref 6–23)
CO2: 25 mEq/L (ref 19–32)
Chloride: 103 mEq/L (ref 96–112)
Creatinine, Ser: 0.73 mg/dL (ref 0.50–1.35)
GFR calc Af Amer: >90 mL/min (ref >90)
GFR calc non Af Amer: >90 mL/min (ref >90)
Glucose, Bld: 148 mg/dL — ABNORMAL HIGH (ref 70–99)
Potassium: 4.7 mEq/L (ref 3.5–5.1)
Sodium: 138 mEq/L (ref 135–145)

## 2011-08-29 LAB — CBC
Hemoglobin: 16.1 g/dL (ref 13.0–17.0)
MCH: 31.1 pg (ref 26.0–34.0)
MCHC: 34.8 g/dL (ref 30.0–36.0)
RDW: 14.1 % (ref 11.5–15.5)

## 2011-08-29 LAB — SURGICAL PCR SCREEN: MRSA, PCR: NEGATIVE

## 2011-08-29 NOTE — Progress Notes (Signed)
HAVE REQUESTED LAST OFFICE NOTE FROM DR Vickii Penna,   (AT  [facility] IN [place], [place]) HAVE REQUESTED  SLEEP STUDY RESULTS FROM [facility] HAVE REQUESTED  HEART CATH RESULTS FROM [facility].Marland Kitchen

## 2011-08-31 ENCOUNTER — Encounter (HOSPITAL_COMMUNITY): Payer: Self-pay | Admitting: Pharmacy Technician

## 2011-08-31 MED ORDER — CEFAZOLIN SODIUM-DEXTROSE 2-3 GM-% IV SOLR
2.0000 g | INTRAVENOUS | Status: DC
  Filled 2011-08-31: qty 50

## 2011-09-01 ENCOUNTER — Ambulatory Visit (HOSPITAL_COMMUNITY)
Admission: RE | Admit: 2011-09-01 | Discharge: 2011-09-01 | Disposition: A | Discharge status: Home / Self Care | Payer: Managed Care, Other (non HMO) | Source: Ambulatory Visit | Attending: Neurosurgery | Admitting: Neurosurgery

## 2011-09-01 DIAGNOSIS — Y831 Surgical operation with implant of artificial internal device as the cause of abnormal reaction of the patient, or of later complication, without mention of misadventure at the time of the procedure: Secondary | ICD-10-CM | POA: Insufficient documentation

## 2011-09-01 DIAGNOSIS — Z538 Procedure and treatment not carried out for other reasons: Secondary | ICD-10-CM | POA: Insufficient documentation

## 2011-09-01 DIAGNOSIS — T85695A Other mechanical complication of other nervous system device, implant or graft, initial encounter: Secondary | ICD-10-CM | POA: Insufficient documentation

## 2011-09-01 DIAGNOSIS — Z01818 Encounter for other preprocedural examination: Secondary | ICD-10-CM | POA: Insufficient documentation

## 2011-09-01 NOTE — Discharge Summary (Signed)
Unfortunately has come to my attention at that the baclofen pump representative was never contacted. I discussed this with the patient and explained we do not have a pump to replace. We'll plan to reschedule surgery for next week the surgery canceled.

## 2011-09-02 MED FILL — Cefazolin Sodium for IV Soln 2 GM and Dextrose 3% (50 ML): INTRAVENOUS | Qty: 50 | Status: AC

## 2011-09-05 ENCOUNTER — Encounter (HOSPITAL_COMMUNITY): Payer: Self-pay | Admitting: Certified Registered"

## 2011-09-05 ENCOUNTER — Ambulatory Visit (HOSPITAL_COMMUNITY)
Admission: RE | Admit: 2011-09-05 | Discharge: 2011-09-05 | Disposition: A | Discharge status: Home / Self Care | Payer: Managed Care, Other (non HMO) | Source: Ambulatory Visit | Attending: Neurosurgery | Admitting: Neurosurgery

## 2011-09-05 ENCOUNTER — Ambulatory Visit (HOSPITAL_COMMUNITY): Payer: Managed Care, Other (non HMO) | Admitting: Certified Registered"

## 2011-09-05 ENCOUNTER — Ambulatory Visit: Admit: 2011-09-05 | Payer: Self-pay | Admitting: Neurosurgery

## 2011-09-05 ENCOUNTER — Encounter (HOSPITAL_COMMUNITY): Payer: Self-pay | Admitting: Surgery

## 2011-09-05 ENCOUNTER — Encounter (HOSPITAL_COMMUNITY)
Admission: RE | Disposition: A | Discharge status: Home / Self Care | Payer: Self-pay | Source: Ambulatory Visit | Attending: Neurosurgery

## 2011-09-05 ENCOUNTER — Other Ambulatory Visit: Payer: Self-pay | Admitting: Neurosurgery

## 2011-09-05 DIAGNOSIS — J4489 Other specified chronic obstructive pulmonary disease: Secondary | ICD-10-CM | POA: Insufficient documentation

## 2011-09-05 DIAGNOSIS — R259 Unspecified abnormal involuntary movements: Secondary | ICD-10-CM | POA: Insufficient documentation

## 2011-09-05 DIAGNOSIS — T85695A Other mechanical complication of other nervous system device, implant or graft, initial encounter: Secondary | ICD-10-CM | POA: Insufficient documentation

## 2011-09-05 DIAGNOSIS — Y831 Surgical operation with implant of artificial internal device as the cause of abnormal reaction of the patient, or of later complication, without mention of misadventure at the time of the procedure: Secondary | ICD-10-CM | POA: Insufficient documentation

## 2011-09-05 DIAGNOSIS — R0602 Shortness of breath: Secondary | ICD-10-CM | POA: Insufficient documentation

## 2011-09-05 DIAGNOSIS — I509 Heart failure, unspecified: Secondary | ICD-10-CM | POA: Insufficient documentation

## 2011-09-05 DIAGNOSIS — I10 Essential (primary) hypertension: Secondary | ICD-10-CM | POA: Insufficient documentation

## 2011-09-05 DIAGNOSIS — J449 Chronic obstructive pulmonary disease, unspecified: Secondary | ICD-10-CM | POA: Insufficient documentation

## 2011-09-05 DIAGNOSIS — G4730 Sleep apnea, unspecified: Secondary | ICD-10-CM | POA: Insufficient documentation

## 2011-09-05 DIAGNOSIS — M4714 Other spondylosis with myelopathy, thoracic region: Secondary | ICD-10-CM | POA: Insufficient documentation

## 2011-09-05 HISTORY — PX: PAIN PUMP IMPLANTATION: SHX330

## 2011-09-05 SURGERY — PAIN PUMP INSERTION
Anesthesia: General | Site: Abdomen | Wound class: Clean

## 2011-09-05 SURGERY — PAIN PUMP INSERTION
Anesthesia: General

## 2011-09-05 MED ORDER — BACLOFEN 40 MG/20ML IT SOLN
INTRATHECAL | Status: DC | PRN
  Administered 2011-09-05 (×2): 40 mg via INTRATHECAL

## 2011-09-05 MED ORDER — FENTANYL CITRATE 0.05 MG/ML IJ SOLN
INTRAMUSCULAR | Status: DC | PRN
  Administered 2011-09-05 (×2): 100 ug via INTRAVENOUS

## 2011-09-05 MED ORDER — CEFAZOLIN SODIUM 1-5 GM-% IV SOLN
INTRAVENOUS | Status: AC
  Filled 2011-09-05: qty 50

## 2011-09-05 MED ORDER — MIDAZOLAM HCL 5 MG/5ML IJ SOLN
INTRAMUSCULAR | Status: DC | PRN
  Administered 2011-09-05: 1 mg via INTRAVENOUS

## 2011-09-05 MED ORDER — ROCURONIUM BROMIDE 100 MG/10ML IV SOLN
INTRAVENOUS | Status: DC | PRN
  Administered 2011-09-05: 10 mg via INTRAVENOUS

## 2011-09-05 MED ORDER — SODIUM CHLORIDE 0.9 % IV SOLN
INTRAVENOUS | Status: AC
  Filled 2011-09-05: qty 500

## 2011-09-05 MED ORDER — CEFAZOLIN SODIUM 1-5 GM-% IV SOLN: 1.0000 g | INTRAVENOUS | Status: DC

## 2011-09-05 MED ORDER — SUCCINYLCHOLINE CHLORIDE 20 MG/ML IJ SOLN
INTRAMUSCULAR | Status: DC | PRN
  Administered 2011-09-05: 100 mg via INTRAVENOUS

## 2011-09-05 MED ORDER — BACITRACIN 50000 UNITS IM SOLR
INTRAMUSCULAR | Status: AC
  Filled 2011-09-05: qty 1

## 2011-09-05 MED ORDER — 0.9 % SODIUM CHLORIDE (POUR BTL) OPTIME
TOPICAL | Status: DC | PRN
  Administered 2011-09-05: 1000 mL

## 2011-09-05 MED ORDER — ALBUTEROL SULFATE HFA 108 (90 BASE) MCG/ACT IN AERS
INHALATION_SPRAY | RESPIRATORY_TRACT | Status: DC | PRN
  Administered 2011-09-05: 2
  Administered 2011-09-05 (×2): 2 via RESPIRATORY_TRACT

## 2011-09-05 MED ORDER — PHENYLEPHRINE HCL 10 MG/ML IJ SOLN
INTRAMUSCULAR | Status: DC | PRN
  Administered 2011-09-05: 40 ug via INTRAVENOUS
  Administered 2011-09-05: 80 ug via INTRAVENOUS
  Administered 2011-09-05: 40 ug via INTRAVENOUS
  Administered 2011-09-05: 80 ug via INTRAVENOUS

## 2011-09-05 MED ORDER — ONDANSETRON HCL 4 MG/2ML IJ SOLN: 4.0000 mg | Freq: Once | INTRAMUSCULAR | Status: DC | PRN

## 2011-09-05 MED ORDER — BUPIVACAINE-EPINEPHRINE 0.5% -1:200000 IJ SOLN
INTRAMUSCULAR | Status: DC | PRN
  Administered 2011-09-05: 10 mL

## 2011-09-05 MED ORDER — EPHEDRINE SULFATE 50 MG/ML IJ SOLN
INTRAMUSCULAR | Status: DC | PRN
  Administered 2011-09-05 (×2): 10 mg via INTRAVENOUS
  Administered 2011-09-05: 5 mg via INTRAVENOUS

## 2011-09-05 MED ORDER — HYDROMORPHONE HCL PF 1 MG/ML IJ SOLN: 0.2500 mg | INTRAMUSCULAR | Status: DC | PRN

## 2011-09-05 MED ORDER — CEFAZOLIN SODIUM-DEXTROSE 2-3 GM-% IV SOLR
INTRAVENOUS | Status: AC
  Administered 2011-09-05: 2 g via INTRAVENOUS
  Filled 2011-09-05: qty 50

## 2011-09-05 MED ORDER — PROPOFOL 10 MG/ML IV BOLUS
INTRAVENOUS | Status: DC | PRN
  Administered 2011-09-05: 200 mg via INTRAVENOUS

## 2011-09-05 MED ORDER — CEFAZOLIN SODIUM-DEXTROSE 2-3 GM-% IV SOLR: 2.0000 g | INTRAVENOUS | Status: DC

## 2011-09-05 MED ORDER — BACITRACIN 50000 UNITS IM SOLR
INTRAMUSCULAR | Status: DC | PRN
  Administered 2011-09-05: 16:00:00

## 2011-09-05 MED ORDER — LACTATED RINGERS IV SOLN
INTRAVENOUS | Status: DC | PRN
  Administered 2011-09-05 (×2): via INTRAVENOUS

## 2011-09-05 SURGICAL SUPPLY — 74 items
APL SKNCLS STERI-STRIP NONHPOA (GAUZE/BANDAGES/DRESSINGS) ×1
Acessory Kit ×2 IMPLANT
BAG DECANTER FOR FLEXI CONT (MISCELLANEOUS) ×2 IMPLANT
BENZOIN TINCTURE PRP APPL 2/3 (GAUZE/BANDAGES/DRESSINGS) ×2 IMPLANT
BLADE CLIPPER SURG NEURO (BLADE) ×4 IMPLANT
BLADE SURG 10 STRL SS (BLADE) ×4 IMPLANT
BLADE SURG 15 STRL LF DISP TIS (BLADE) ×1 IMPLANT
BLADE SURG 15 STRL SS (BLADE) ×2
BOOT SUTURE AID YELLOW STND (SUTURE) IMPLANT
BRUSH SCRUB EZ 1% IODOPHOR (MISCELLANEOUS) ×1 IMPLANT
BRUSH SCRUB EZ PLAIN DRY (MISCELLANEOUS) ×2 IMPLANT
CANISTER SUCTION 2500CC (MISCELLANEOUS) ×2 IMPLANT
CLOSURE STERI STRIP 1/2 X4 (GAUZE/BANDAGES/DRESSINGS) ×1 IMPLANT
CLOTH BEACON ORANGE TIMEOUT ST (SAFETY) ×2 IMPLANT
CONT SPEC 4OZ CLIKSEAL STRL BL (MISCELLANEOUS) ×2 IMPLANT
CORDS BIPOLAR (ELECTRODE) ×2 IMPLANT
COVER MAYO STAND STRL (DRAPES) ×2 IMPLANT
DRAPE C-ARM 42X72 X-RAY (DRAPES) ×2 IMPLANT
DRAPE INCISE IOBAN 66X45 STRL (DRAPES) ×1 IMPLANT
DRAPE INCISE IOBAN 85X60 (DRAPES) ×2 IMPLANT
DRAPE LAPAROTOMY 100X72X124 (DRAPES) ×1 IMPLANT
DRAPE ORTHO SPLIT 77X108 STRL (DRAPES) ×4
DRAPE POUCH INSTRU U-SHP 10X18 (DRAPES) ×2 IMPLANT
DRAPE PROXIMA HALF (DRAPES) ×1 IMPLANT
DRAPE SURG 17X23 STRL (DRAPES) ×12 IMPLANT
DRAPE SURG ORHT 6 SPLT 77X108 (DRAPES) ×2 IMPLANT
ELECT REM PT RETURN 9FT ADLT (ELECTROSURGICAL) ×2
ELECTRODE REM PT RTRN 9FT ADLT (ELECTROSURGICAL) ×1 IMPLANT
GAUZE SPONGE 4X4 12PLY STRL LF (GAUZE/BANDAGES/DRESSINGS) ×2 IMPLANT
GAUZE SPONGE 4X4 16PLY XRAY LF (GAUZE/BANDAGES/DRESSINGS) ×4 IMPLANT
GLOVE BIO SURGEON STRL SZ8.5 (GLOVE) ×2 IMPLANT
GLOVE EXAM NITRILE LRG STRL (GLOVE) IMPLANT
GLOVE EXAM NITRILE MD LF STRL (GLOVE) IMPLANT
GLOVE EXAM NITRILE XL STR (GLOVE) IMPLANT
GLOVE EXAM NITRILE XS STR PU (GLOVE) IMPLANT
GLOVE SS BIOGEL STRL SZ 8 (GLOVE) ×1 IMPLANT
GLOVE SUPERSENSE BIOGEL SZ 8 (GLOVE) ×1
GOWN BRE IMP SLV AUR LG STRL (GOWN DISPOSABLE) ×2 IMPLANT
GOWN BRE IMP SLV AUR XL STRL (GOWN DISPOSABLE) IMPLANT
KIT BASIN OR (CUSTOM PROCEDURE TRAY) ×2 IMPLANT
KIT ROOM TURNOVER OR (KITS) ×2 IMPLANT
MARKER SKIN DUAL TIP RULER LAB (MISCELLANEOUS) ×1 IMPLANT
NEEDLE HYPO 18GX1.5 BLUNT FILL (NEEDLE) IMPLANT
NEEDLE HYPO 22GX1.5 SAFETY (NEEDLE) ×4 IMPLANT
NS IRRIG 1000ML POUR BTL (IV SOLUTION) ×2 IMPLANT
PACK EENT II TURBAN DRAPE (CUSTOM PROCEDURE TRAY) ×2 IMPLANT
PAD ARMBOARD 7.5X6 YLW CONV (MISCELLANEOUS) ×6 IMPLANT
PATTIES SURGICAL .5 X.5 (GAUZE/BANDAGES/DRESSINGS) IMPLANT
PATTIES SURGICAL 1X1 (DISPOSABLE) IMPLANT
PENCIL BUTTON HOLSTER BLD 10FT (ELECTRODE) ×2 IMPLANT
SHEATH PERITONEAL INTRO 46 (MISCELLANEOUS) IMPLANT
SHEATH PERITONEAL INTRO 61 (MISCELLANEOUS) IMPLANT
SPONGE LAP 4X18 X RAY DECT (DISPOSABLE) ×1 IMPLANT
STAPLER SKIN PROX WIDE 3.9 (STAPLE) ×2 IMPLANT
STRIP CLOSURE SKIN 1/2X4 (GAUZE/BANDAGES/DRESSINGS) IMPLANT
SUT BONE WAX W31G (SUTURE) IMPLANT
SUT ETHILON 3 0 PS 1 (SUTURE) IMPLANT
SUT SILK 0 TIES 10X30 (SUTURE) IMPLANT
SUT SILK 2 0 FS (SUTURE) ×2 IMPLANT
SUT SILK 3 0 SH 30 (SUTURE) IMPLANT
SUT VIC AB 0 CT1 18XCR BRD8 (SUTURE) ×1 IMPLANT
SUT VIC AB 0 CT1 8-18 (SUTURE) ×2
SUT VIC AB 2-0 CP2 18 (SUTURE) ×3 IMPLANT
SYR 20CC LL (SYRINGE) ×3 IMPLANT
SYR 3ML LL SCALE MARK (SYRINGE) ×3 IMPLANT
SYR 5ML LL (SYRINGE) ×1 IMPLANT
SYR CONTROL 10ML LL (SYRINGE) ×2 IMPLANT
SynchroMed II Pump ×2 IMPLANT
TAPE CLOTH SURG 4X10 WHT LF (GAUZE/BANDAGES/DRESSINGS) ×2 IMPLANT
TOWEL OR 17X24 6PK STRL BLUE (TOWEL DISPOSABLE) ×2 IMPLANT
TOWEL OR 17X26 10 PK STRL BLUE (TOWEL DISPOSABLE) ×2 IMPLANT
TUBE CONNECTING 12X1/4 (SUCTIONS) ×2 IMPLANT
UNDERPAD 30X30 INCONTINENT (UNDERPADS AND DIAPERS) ×1 IMPLANT
WATER STERILE IRR 1000ML POUR (IV SOLUTION) ×2 IMPLANT

## 2011-09-05 NOTE — Progress Notes (Signed)
Pt reports eating Bacon/Egg sandwich at 0430 this AM.  Dr. Katrinka Blazing notified and states to proceed w/procedure.//L. Anquinette Pierro,RN

## 2011-09-05 NOTE — Anesthesia Preprocedure Evaluation (Addendum)
Anesthesia Evaluation  Patient identified by MRN, date of birth, ID band Patient awake    Reviewed: Allergy & Precautions, H&P , NPO status , Patient's Chart, lab work & pertinent test results  History of Anesthesia Complications (+) DIFFICULT AIRWAY  Airway Mallampati: II TM Distance: >3 FB Neck ROM: full    Dental  (+) Upper Dentures, Lower Dentures, Edentulous Upper and Edentulous Lower   Pulmonary shortness of breath, with exertion, at rest and lying, sleep apnea, Continuous Positive Airway Pressure Ventilation and Oxygen sleep apnea , COPD COPD inhaler, Current Smoker,          Cardiovascular hypertension, Pt. on medications +CHF + Valvular Problems/Murmurs Rhythm:regular Rate:Normal     Neuro/Psych    GI/Hepatic GERD-  Medicated,  Endo/Other  Morbid obesity  Renal/GU      Musculoskeletal   Abdominal   Peds  Hematology   Anesthesia Other Findings   Reproductive/Obstetrics                          Anesthesia Physical Anesthesia Plan  ASA: III  Anesthesia Plan: General ETT   Post-op Pain Management:    Induction: Intravenous  Airway Management Planned: Oral ETT  Additional Equipment:   Intra-op Plan:   Post-operative Plan: Extubation in OR  Informed Consent: I have reviewed the patients History and Physical, chart, labs and discussed the procedure including the risks, benefits and alternatives for the proposed anesthesia with the patient or authorized representative who has indicated his/her understanding and acceptance.   Dental advisory given  Plan Discussed with: Anesthesiologist, CRNA and Surgeon  Anesthesia Plan Comments:       Anesthesia Quick Evaluation

## 2011-09-05 NOTE — Progress Notes (Signed)
Subjective:  The patient is somnolent but easily arousable. He looks well.  Objective: Vital signs in last 24 hours: Temp:  [97.9 F (36.6 C)-98.4 F (36.9 C)] 97.9 F (36.6 C) (03/11 1720) Pulse Rate:  [98-104] 104  (03/11 1720) Resp:  [19-20] 19  (03/11 1720) BP: (115-150)/(69-82) 115/69 mmHg (03/11 1720) SpO2:  [91 %-97 %] 91 % (03/11 1720)  Intake/Output from previous day:   Intake/Output this shift: Total I/O In: 1400 [I.V.:1400] Out: 50 [Blood:50]  Physical exam the patient is moving all 4 extremities well.  Lab Results: No results found for this basename: WBC:2,HGB:2,HCT:2,PLT:2 in the last 72 hours BMET No results found for this basename: NA:2,K:2,CL:2,CO2:2,GLUCOSE:2,BUN:2,CREATININE:2,CALCIUM:2 in the last 72 hours  Studies/Results: No results found.  Assessment/Plan: The patient is doing well.  LOS: 0 days     [name] D 09/05/2011, 5:41 PM

## 2011-09-05 NOTE — Anesthesia Postprocedure Evaluation (Signed)
  Anesthesia Post-op Note  Patient: [name]  Procedure(s) Performed: Procedure(s) (LRB): PAIN PUMP INSERTION (N/A)  Patient Location: PACU  Anesthesia Type: General  Level of Consciousness: awake, alert , oriented and patient cooperative  Airway and Oxygen Therapy: Patient Spontanous Breathing and Patient connected to face mask oxygen  Post-op Pain: none  Post-op Assessment: Post-op Vital signs reviewed, Patient's Cardiovascular Status Stable, Respiratory Function Stable, Patent Airway, No signs of Nausea or vomiting and Pain level controlled  Post-op Vital Signs: stable  Complications: No apparent anesthesia complications

## 2011-09-05 NOTE — Progress Notes (Signed)
Dr. Lovell Sheehan notified of pts desire to go home tonight.  Dr. Lovell Sheehan stated he was to go to 3500 and go through the normal routine in order to be discharged (void, etc.).

## 2011-09-05 NOTE — Progress Notes (Signed)
Pt. Was given back his dentures and he put them in his mouth.

## 2011-09-05 NOTE — Op Note (Signed)
Brief history: The patient is a 56 year old white male who has a spasticity of unknown origin. He's had a baclofen pump placed. He's had multiple revisions of this pump and catheter. His present pump as low battery and is to be placed. I discussed the procedure, the risks, benefits and the alternatives. Advanced all the patient's questions. He like to proceed with surgery.  Preop diagnosis: Baclofen pump malfunction(low battery)  Postop diagnosis: Same  Procedure: Replacement of baclofen pump  Surgeon: Dr. Delma Officer  Asst.: None  Anesthesia: Gen. endotracheal  Estimated blood loss: Minimal  Specimens: None  Drains: None  Complications: None  Description of procedure: The patient was brought to the operating room by the anesthesia team. General endotracheal anesthesia was induced. The patient was placed in the lateral position with his left side up. The patient's abdomen and a more recently then she with clippers and prepared Betadine scrub and Betadine solution sterile drapes were applied I then injected the area to be incised with Marcaine with epinephrine solution a scalpel to make a linear midline incision through the patient's left abdominal incision just above his baclofen pump. I used the cautery to expose the pump. Uses cerebellar retractor for exposure. I removed the pump from the patient's subcutaneous tissue. I disconnected the catheter from the pump. I then used a Syringe to aspirate from the patient's thecal catheter. We had free and easy flow cerebral l spinal fluid. Withdrew approximately 3 cc. This demonstrated the patency of the thecal catheter.  We then prepared the new pack of the pump by filling it with baclofen. I then placed the back of the pump in a "sock". I connected the thecal catheter to the pump via the snap connector. I then withdrew approximately 1 cc through the catheter withdrawal port. This demonstrated the patency of the pump and catheter. I then inserted  the catheter in the pump into the patient's subcutaneous pocket. Placed the catheter behind the pump. In nature there is no "kinks". I then irrigated the wound bacitracin solution. I removed the retractor and reapproximated patient's obtain see to it with interrupted 2-0 Vicryl suture and the skin with Steri-Strips and benzoin. The was then coated with bacitracin ointment a sterile dressing was applied the drapes were removed and the patient is a extubated by the anesthesia team and transported to the post anesthesia care unit in stable condition. All sponge instrument and needle counts were correct in his case. I then calculated the priming bolus of 0.199 cc and the catheter by 0.206 cc for a total priming bolus of 0.405 cc. We then primed the catheter.

## 2011-09-05 NOTE — Preoperative (Signed)
Beta Blockers   Reason not to administer Beta Blockers:Not Applicable 

## 2011-09-05 NOTE — H&P (Signed)
Subjective: The patient is a 56 year old white male who has a spasticity of unknown etiology. This is managed by Dr. Billie Ruddy. The patient has had multiple placements of baclofen pumps with revisions primarily of the proximal catheter. The patient's present baclofen pump battery is low in is to be replaced. I discussed this with the patient and we discussed the risks, benefits and alternatives of procedure. The patient like to proceed with the surgery.  Past Medical History  Diagnosis Date  . CHF (congestive heart failure)   . COPD (chronic obstructive pulmonary disease)   . Thoracic myelopathy   . Incontinence   . Insomnia   . Morbid obesity   . Spastic paraparesis   . Hypertension   . Heart murmur     was told yrs ago  . Shortness of breath     all the time  . Sleep apnea     set  at  9  . GERD (gastroesophageal reflux disease)     Past Surgical History  Procedure Date  . Baclofen infusion pump   . Eye surgery   . Cardiac catheterization     Allergies  Allergen Reactions  . Diamox (Acetazolamide) Rash    History  Substance Use Topics  . Smoking status: Current Everyday Smoker -- 1.0 packs/day for 38 years    Types: Cigarettes  . Smokeless tobacco: Not on file   Comment: Quit recent.  . Alcohol Use: No    Family History  Problem Relation Age of Onset  . Cancer Father 18  . Emphysema Mother 49   Prior to Admission medications   Medication Sig Start Date End Date Taking? Authorizing Provider  BACLOFEN IT Continuous intrathecal infusion pump   Yes Historical Provider, MD  Fluticasone-Salmeterol (ADVAIR) 250-50 MCG/DOSE AEPB Inhale 1 puff into the lungs 2 (two) times daily.   Yes Historical Provider, MD  furosemide (LASIX) 20 MG tablet Take 20 mg by mouth daily.   Yes Historical Provider, MD  gemfibrozil (LOPID) 600 MG tablet Take 600 mg by mouth daily.   Yes Historical Provider, MD  lisinopril-hydrochlorothiazide (PRINZIDE,ZESTORETIC) 20-25 MG per tablet Take 1  tablet by mouth daily.   Yes Historical Provider, MD  LORazepam (ATIVAN) 2 MG tablet Take 2 mg by mouth every 8 (eight) hours as needed. For anxiety   Yes Historical Provider, MD  multivitamin [facility]) per tablet Take 2 tablets by mouth daily.   Yes Historical Provider, MD  oxyCODONE-acetaminophen (PERCOCET) 10-325 MG per tablet Take 1 tablet by mouth every 8 (eight) hours as needed. For pain   Yes Historical Provider, MD  rOPINIRole (REQUIP) 1 MG tablet Take 1 mg by mouth daily.   Yes Historical Provider, MD     Review of Systems  Positive ROS: Negative except as above  All other systems have been reviewed and were otherwise negative with the exception of those mentioned in the HPI and as above.  Objective: Vital signs in last 24 hours: Temp:  [98.4 F (36.9 C)] 98.4 F (36.9 C) (03/11 1338) Pulse Rate:  [98] 98  (03/11 1338) Resp:  [20] 20  (03/11 1338) BP: (150)/(82) 150/82 mmHg (03/11 1338) SpO2:  [97 %] 97 % (03/11 1338)  General Appearance: Alert, cooperative, no distress, appears stated age Head: Normocephalic, without obvious abnormality, atraumatic Eyes: PERRL, conjunctiva/corneas clear, EOM's intact, fundi benign, both eyes      Ears: Normal TM's and external ear canals, both ears Throat: Lips, mucosa, and tongue normal; teeth and gums normal Neck:  Supple, symmetrical, trachea midline, no adenopathy; thyroid: No enlargement/tenderness/nodules; no carotid bruit or JVD Back: Symmetric, no curvature, ROM normal, no CVA tenderness his incisions are well-healed Lungs: Clear to auscultation bilaterally, respirations unlabored Heart: Regular rate and rhythm, S1 and S2 normal, no murmur, rub or gallop Abdomen: Soft, non-tender, bowel sounds active all four quadrants, no masses, no organomegaly obese, his incisions are well-healed. Extremities: Extremities normal, atraumatic, no cyanosis or edema Pulses: 2+ and symmetric all extremities Skin: Skin color, texture, turgor normal,  no rashes or lesions  NEUROLOGIC:   Mental status: alert and oriented, no aphasia, good attention span, Fund of knowledge/ memory ok Motor Exam - grossly normal, the patient is spastic, he has weakness in his right psoas and quadriceps. Sensory Exam - grossly normal Reflexes: Hyperreflexia Coordination - grossly normal Gait - grossly normal Balance - grossly normal Cranial Nerves: I: smell Not tested  II: visual acuity  OS: Normal    OD: Normal   II: visual fields Full to confrontation  II: pupils  the patient's left pupil is 7 mm, irregular and not reactive.   III,VII: ptosis None  III,IV,VI: extraocular muscles  Full ROM  V: mastication Normal  V: facial light touch sensation  Normal  V,VII: corneal reflex  Present  VII: facial muscle function - upper  Normal  VII: facial muscle function - lower Normal  VIII: hearing Not tested  IX: soft palate elevation  Normal  IX,X: gag reflex Present  XI: trapezius strength  5/5  XI: sternocleidomastoid strength 5/5  XI: neck flexion strength  5/5  XII: tongue strength  Normal    Data Review Lab Results  Component Value Date   WBC 11.6* 08/29/2011   HGB 16.1 08/29/2011   HCT 46.2 08/29/2011   MCV 89.2 08/29/2011   PLT 250 08/29/2011   Lab Results  Component Value Date   NA 138 08/29/2011   K 4.7 08/29/2011   CL 103 08/29/2011   CO2 25 08/29/2011   BUN 13 08/29/2011   CREATININE 0.73 08/29/2011   GLUCOSE 148* 08/29/2011   No results found for this basename: INR, PROTIME    Assessment/Plan: Spasticity of unknown etiology: The patient's present baclofen pumps battery is low and needs to be replaced. I discussed this with the patient. I described the procedure of replacement obvious pump and possibly of the catheters well. I described the surgery to him. I discussed the risks, benefits, alternatives and likelihood of achieving our goals with surgery. I advanced all the patient questions. He wants to proceed with the operation.   Yoshino Broccoli  D 09/05/2011 3:30 PM

## 2011-09-05 NOTE — Transfer of Care (Signed)
Immediate Anesthesia Transfer of Care Note  Patient: [name]  Procedure(s) Performed: Procedure(s) (LRB): PAIN PUMP INSERTION (N/A)  Patient Location: PACU  Anesthesia Type: General  Level of Consciousness: awake and alert   Airway & Oxygen Therapy: Patient Spontanous Breathing and Patient connected to face mask oxygen  Post-op Assessment: Report given to PACU RN and Post -op Vital signs reviewed and stable  Post vital signs: Reviewed and stable  Complications: No apparent anesthesia complications

## 2011-09-07 MED FILL — Baclofen Intrathecal Inj 40 MG/20ML (2000 MCG/ML): INTRATHECAL | Qty: 40 | Status: AC

## 2011-09-07 MED FILL — Baclofen Intrathecal Inj 40 MG/20ML (2000 MCG/ML): INTRATHECAL | Qty: 40 | Status: CN

## 2011-09-12 ENCOUNTER — Other Ambulatory Visit (HOSPITAL_COMMUNITY): Payer: Managed Care, Other (non HMO)

## 2011-09-13 ENCOUNTER — Encounter (HOSPITAL_COMMUNITY): Payer: Self-pay | Admitting: Neurosurgery

## 2011-09-22 ENCOUNTER — Other Ambulatory Visit (HOSPITAL_COMMUNITY): Payer: Managed Care, Other (non HMO)

## 2011-10-20 ENCOUNTER — Encounter: Payer: Self-pay | Admitting: Cardiology

## 2011-11-08 ENCOUNTER — Encounter: Payer: Self-pay | Admitting: Pediatrics

## 2012-01-02 ENCOUNTER — Emergency Department: Payer: Self-pay | Admitting: Emergency Medicine

## 2012-01-02 LAB — CBC
HCT: 46.5 % (ref 40.0–52.0)
MCH: 30.4 pg (ref 26.0–34.0)
MCV: 91 fL (ref 80–100)
Platelet: 266 10*3/uL (ref 150–440)
RBC: 5.09 10*6/uL (ref 4.40–5.90)
WBC: 14.1 10*3/uL — ABNORMAL HIGH (ref 3.8–10.6)

## 2012-01-02 LAB — URINALYSIS, COMPLETE
Bacteria: NONE SEEN
Bilirubin,UR: NEGATIVE
Blood: NEGATIVE
Glucose,UR: NEGATIVE mg/dL (ref 0–75)
Leukocyte Esterase: NEGATIVE
Nitrite: NEGATIVE
Protein: 30
RBC,UR: 3 /HPF (ref 0–5)
Squamous Epithelial: NONE SEEN

## 2012-01-02 LAB — BASIC METABOLIC PANEL
BUN: 17 mg/dL (ref 7–18)
Creatinine: 0.78 mg/dL (ref 0.60–1.30)
EGFR (African American): >60
EGFR (Non-African Amer.): >60
Glucose: 179 mg/dL — ABNORMAL HIGH (ref 65–99)
Potassium: 4.3 mmol/L (ref 3.5–5.1)
Sodium: 139 mmol/L (ref 136–145)

## 2012-02-07 ENCOUNTER — Ambulatory Visit: Payer: Self-pay | Admitting: Ophthalmology

## 2012-06-25 ENCOUNTER — Ambulatory Visit: Payer: Self-pay | Admitting: Internal Medicine

## 2012-06-27 HISTORY — PX: ABOVE KNEE LEG AMPUTATION: SUR20

## 2012-07-17 ENCOUNTER — Ambulatory Visit: Payer: Self-pay | Admitting: Vascular Surgery

## 2012-07-17 LAB — BASIC METABOLIC PANEL
Anion Gap: 7 (ref 7–16)
Calcium, Total: 9.3 mg/dL (ref 8.5–10.1)
Co2: 25 mmol/L (ref 21–32)
Osmolality: 275 (ref 275–301)
Potassium: 4.2 mmol/L (ref 3.5–5.1)
Sodium: 138 mmol/L (ref 136–145)

## 2012-07-17 LAB — CBC
MCH: 29.6 pg (ref 26.0–34.0)
MCV: 88 fL (ref 80–100)
RBC: 5.14 10*6/uL (ref 4.40–5.90)

## 2012-07-18 ENCOUNTER — Inpatient Hospital Stay: Payer: Self-pay | Admitting: Vascular Surgery

## 2012-07-19 LAB — BASIC METABOLIC PANEL
BUN: 13 mg/dL (ref 7–18)
Calcium, Total: 8.6 mg/dL (ref 8.5–10.1)
Chloride: 107 mmol/L (ref 98–107)
Creatinine: 0.89 mg/dL (ref 0.60–1.30)
EGFR (Non-African Amer.): >60
Glucose: 94 mg/dL (ref 65–99)
Osmolality: 277 (ref 275–301)
Sodium: 139 mmol/L (ref 136–145)

## 2012-07-19 LAB — CBC WITH DIFFERENTIAL/PLATELET
Basophil %: 0.5 %
HCT: 38.5 % — ABNORMAL LOW (ref 40.0–52.0)
Lymphocyte #: 2.7 10*3/uL (ref 1.0–3.6)
MCHC: 33.3 g/dL (ref 32.0–36.0)
Monocyte #: 0.9 x10 3/mm (ref 0.2–1.0)
Monocyte %: 7.2 %
Neutrophil #: 8.9 10*3/uL — ABNORMAL HIGH (ref 1.4–6.5)
Platelet: 307 10*3/uL (ref 150–440)

## 2012-09-20 ENCOUNTER — Ambulatory Visit: Payer: 59 | Attending: Vascular Surgery | Admitting: Physical Therapy

## 2012-09-20 ENCOUNTER — Telehealth: Payer: Self-pay | Admitting: Family

## 2012-09-20 DIAGNOSIS — G4700 Insomnia, unspecified: Secondary | ICD-10-CM

## 2012-09-20 DIAGNOSIS — IMO0002 Reserved for concepts with insufficient information to code with codable children: Secondary | ICD-10-CM

## 2012-09-20 MED ORDER — ZOLPIDEM TARTRATE 10 MG PO TABS: ORAL_TABLET | ORAL | Status: DC

## 2012-09-20 MED ORDER — OXYCODONE-ACETAMINOPHEN 10-325 MG PO TABS: ORAL_TABLET | ORAL | Status: DC

## 2012-09-20 NOTE — Telephone Encounter (Signed)
I called [name] back. He requested prescriptions for Oxycodone and Ambien to be mailed to him. I will mail the prescriptions as requested.  He also requested to talk with Dr Sharene Skeans about 2 problems. He said that a few days ago, his wife was helping him transfer from wheelchair to toilet and he fell. He had difficulty getting back to the wheelchair and had pain in his lower back. Since that time, he has had no more pain in his back, but has had "dead feeling" and numbness in back of right leg from buttock area all the way down leg.   He has also had increase in tics, violent head jerking for most part. Tics began before the fall. The tics are more violent and more frequent than usual.   I told Tammy Sours that I would relay his concerns to Dr Sharene Skeans. He would like for Dr Sharene Skeans to call him at [phone]

## 2012-09-20 NOTE — Telephone Encounter (Signed)
I spoke with the patient for 13 minutes.  He is treating his tremors with oxycodone and 4 shots of vodka.  He is unable to pass his urine.  I don't know if this is an obstructive uropathy.  I suspect it is neurogenic bladder because he has periods when he becomes suddenly incontinent, and he is also incontinent of feces.  He is not going to be fitted for a prosthesis with his left leg because he is too weak.  He is not in pain in his low back.  He felt the numbness before.  I'm not certain how to fix it.  If we perform an MRI scan of his back and we find evidence of compressive lesions I'm not sure that anyone would take him to surgery.  Performing the MRI scan itself will be difficult. His motor tics worsen when he is trying to pass urine and cannot do so. With all these problems, we need to see him in the office as soon as it can be arranged.

## 2012-09-20 NOTE — Telephone Encounter (Signed)
I called and left a message for Tammy Sours to call me back tomorrow. I have an appointment to offer him on April 1 at [facility].

## 2012-09-21 NOTE — Telephone Encounter (Signed)
I called [name] and offered him an appt on April 1 at 9AM to arrive at 8:45AM. He accepted the appt.

## 2012-09-24 ENCOUNTER — Telehealth: Payer: Self-pay | Admitting: *Deleted

## 2012-09-24 NOTE — Telephone Encounter (Signed)
[name] left voicemail stating he has not received the medications that were mailed.  He wanted to make sure they were mailed and not at the office waiting to be picked up.  I verified that the prescriptions were not here and called Tammy Sours to let him know.  He was advised to let us know if he did not receive them tomorrow.

## 2012-09-25 ENCOUNTER — Ambulatory Visit (INDEPENDENT_AMBULATORY_CARE_PROVIDER_SITE_OTHER): Payer: 59 | Admitting: Pediatrics

## 2012-09-25 ENCOUNTER — Encounter: Payer: Self-pay | Admitting: Pediatrics

## 2012-09-25 VITALS — BP 130/60 | HR 84

## 2012-09-25 DIAGNOSIS — R200 Anesthesia of skin: Secondary | ICD-10-CM

## 2012-09-25 DIAGNOSIS — G373 Acute transverse myelitis in demyelinating disease of central nervous system: Secondary | ICD-10-CM

## 2012-09-25 DIAGNOSIS — G0489 Other myelitis: Secondary | ICD-10-CM

## 2012-09-25 DIAGNOSIS — M62838 Other muscle spasm: Secondary | ICD-10-CM

## 2012-09-25 DIAGNOSIS — G4700 Insomnia, unspecified: Secondary | ICD-10-CM

## 2012-09-25 DIAGNOSIS — R209 Unspecified disturbances of skin sensation: Secondary | ICD-10-CM

## 2012-09-25 DIAGNOSIS — IMO0002 Reserved for concepts with insufficient information to code with codable children: Secondary | ICD-10-CM

## 2012-09-25 NOTE — Patient Instructions (Addendum)
I will see if I can get an [facility] occupational and physical therapist to visit you in your home to determine if there's a safe way to get on and off the commode and into your bathtub.  I don't have other ideas about how to treat your back spasms except that we will reprogram your baclofen pump on your next visit to a higher rate.
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
## 2012-10-01 ENCOUNTER — Ambulatory Visit: Payer: 59 | Admitting: Physical Therapy

## 2012-10-02 ENCOUNTER — Telehealth: Payer: Self-pay | Admitting: Family

## 2012-10-02 ENCOUNTER — Ambulatory Visit: Payer: 59 | Attending: Vascular Surgery | Admitting: Physical Therapy

## 2012-10-02 DIAGNOSIS — R5381 Other malaise: Secondary | ICD-10-CM | POA: Insufficient documentation

## 2012-10-02 DIAGNOSIS — IMO0001 Reserved for inherently not codable concepts without codable children: Secondary | ICD-10-CM | POA: Insufficient documentation

## 2012-10-02 NOTE — Telephone Encounter (Signed)
Dewayne Hatch, Physical Therapist with [facility] called and said that she did home visit with Tammy Sours. She wants orders to Nurse and Presenter, broadcasting. He needs some medical equipment at home for his safety. I approved her recommendations.

## 2012-10-02 NOTE — Telephone Encounter (Signed)
Thank you :)

## 2012-10-03 ENCOUNTER — Telehealth: Payer: Self-pay | Admitting: Family

## 2012-10-03 NOTE — Telephone Encounter (Signed)
Thank you :)

## 2012-10-03 NOTE — Telephone Encounter (Signed)
Dewayne Hatch, PT with [facility] called to request orders for pt for hospital bed, TENS unit and transfer board. I approved those orders.

## 2012-10-05 ENCOUNTER — Telehealth: Payer: Self-pay | Admitting: *Deleted

## 2012-10-05 ENCOUNTER — Encounter (HOSPITAL_COMMUNITY): Payer: Self-pay | Admitting: Emergency Medicine

## 2012-10-05 ENCOUNTER — Emergency Department (HOSPITAL_COMMUNITY): Payer: 59

## 2012-10-05 ENCOUNTER — Inpatient Hospital Stay (HOSPITAL_COMMUNITY): Payer: 59

## 2012-10-05 ENCOUNTER — Inpatient Hospital Stay (HOSPITAL_COMMUNITY)
Admission: EM | Admit: 2012-10-05 | Discharge: 2012-10-05 | DRG: 191 | Discharge status: Against Medical Advice | Payer: 59 | Attending: Internal Medicine | Admitting: Internal Medicine

## 2012-10-05 DIAGNOSIS — G8220 Paraplegia, unspecified: Secondary | ICD-10-CM | POA: Diagnosis present

## 2012-10-05 DIAGNOSIS — M79609 Pain in unspecified limb: Secondary | ICD-10-CM | POA: Diagnosis present

## 2012-10-05 DIAGNOSIS — S78119A Complete traumatic amputation at level between unspecified hip and knee, initial encounter: Secondary | ICD-10-CM

## 2012-10-05 DIAGNOSIS — R011 Cardiac murmur, unspecified: Secondary | ICD-10-CM | POA: Diagnosis present

## 2012-10-05 DIAGNOSIS — Z9119 Patient's noncompliance with other medical treatment and regimen: Secondary | ICD-10-CM

## 2012-10-05 DIAGNOSIS — F17200 Nicotine dependence, unspecified, uncomplicated: Secondary | ICD-10-CM | POA: Diagnosis present

## 2012-10-05 DIAGNOSIS — K219 Gastro-esophageal reflux disease without esophagitis: Secondary | ICD-10-CM | POA: Diagnosis present

## 2012-10-05 DIAGNOSIS — L8992 Pressure ulcer of unspecified site, stage 2: Secondary | ICD-10-CM | POA: Diagnosis present

## 2012-10-05 DIAGNOSIS — M4714 Other spondylosis with myelopathy, thoracic region: Secondary | ICD-10-CM | POA: Diagnosis present

## 2012-10-05 DIAGNOSIS — J441 Chronic obstructive pulmonary disease with (acute) exacerbation: Principal | ICD-10-CM | POA: Diagnosis present

## 2012-10-05 DIAGNOSIS — E119 Type 2 diabetes mellitus without complications: Secondary | ICD-10-CM | POA: Diagnosis present

## 2012-10-05 DIAGNOSIS — F1010 Alcohol abuse, uncomplicated: Secondary | ICD-10-CM | POA: Diagnosis present

## 2012-10-05 DIAGNOSIS — Z720 Tobacco use: Secondary | ICD-10-CM

## 2012-10-05 DIAGNOSIS — Z809 Family history of malignant neoplasm, unspecified: Secondary | ICD-10-CM

## 2012-10-05 DIAGNOSIS — I10 Essential (primary) hypertension: Secondary | ICD-10-CM | POA: Diagnosis present

## 2012-10-05 DIAGNOSIS — N390 Urinary tract infection, site not specified: Secondary | ICD-10-CM

## 2012-10-05 DIAGNOSIS — I509 Heart failure, unspecified: Secondary | ICD-10-CM | POA: Diagnosis present

## 2012-10-05 DIAGNOSIS — R531 Weakness: Secondary | ICD-10-CM

## 2012-10-05 DIAGNOSIS — G4730 Sleep apnea, unspecified: Secondary | ICD-10-CM | POA: Diagnosis present

## 2012-10-05 DIAGNOSIS — Z91199 Patient's noncompliance with other medical treatment and regimen due to unspecified reason: Secondary | ICD-10-CM

## 2012-10-05 DIAGNOSIS — R0602 Shortness of breath: Secondary | ICD-10-CM | POA: Diagnosis present

## 2012-10-05 DIAGNOSIS — L89109 Pressure ulcer of unspecified part of back, unspecified stage: Secondary | ICD-10-CM | POA: Diagnosis present

## 2012-10-05 LAB — CBC WITH DIFFERENTIAL/PLATELET
Basophils Relative: 1 % (ref 0–1)
Eosinophils Absolute: 0.4 10*3/uL (ref 0.0–0.7)
MCH: 31.3 pg (ref 26.0–34.0)
MCHC: 35.4 g/dL (ref 30.0–36.0)
Neutrophils Relative %: 60 % (ref 43–77)
Platelets: 215 10*3/uL (ref 150–400)
RDW: 16.3 % — ABNORMAL HIGH (ref 11.5–15.5)

## 2012-10-05 LAB — URINALYSIS, ROUTINE W REFLEX MICROSCOPIC
Glucose, UA: NEGATIVE mg/dL
Specific Gravity, Urine: 1.005 (ref 1.005–1.030)
Urobilinogen, UA: 0.2 mg/dL (ref 0.0–1.0)

## 2012-10-05 LAB — COMPREHENSIVE METABOLIC PANEL
ALT: 48 U/L (ref 0–53)
Albumin: 3 g/dL — ABNORMAL LOW (ref 3.5–5.2)
Alkaline Phosphatase: 240 U/L — ABNORMAL HIGH (ref 39–117)
Potassium: 3.1 mEq/L — ABNORMAL LOW (ref 3.5–5.1)
Sodium: 138 mEq/L (ref 135–145)
Total Protein: 6.9 g/dL (ref 6.0–8.3)

## 2012-10-05 LAB — TROPONIN I: Troponin I: <0.3 ng/mL (ref <0.30)

## 2012-10-05 LAB — URINE MICROSCOPIC-ADD ON

## 2012-10-05 MED ORDER — INSULIN ASPART 100 UNIT/ML ~~LOC~~ SOLN: 0.0000 [IU] | Freq: Three times a day (TID) | SUBCUTANEOUS | Status: DC

## 2012-10-05 MED ORDER — POTASSIUM CHLORIDE 10 MEQ/100ML IV SOLN
10.0000 meq | INTRAVENOUS | Status: DC
  Filled 2012-10-05 (×4): qty 100

## 2012-10-05 MED ORDER — DEXTROSE 5 % IV SOLN
1.0000 g | Freq: Once | INTRAVENOUS | Status: AC
  Administered 2012-10-05: 1 g via INTRAVENOUS
  Filled 2012-10-05: qty 10

## 2012-10-05 MED ORDER — LORAZEPAM 2 MG/ML IJ SOLN: 1.0000 mg | Freq: Four times a day (QID) | INTRAMUSCULAR | Status: DC | PRN

## 2012-10-05 MED ORDER — ONDANSETRON HCL 4 MG/2ML IJ SOLN: 4.0000 mg | Freq: Four times a day (QID) | INTRAMUSCULAR | Status: DC | PRN

## 2012-10-05 MED ORDER — OXYCODONE HCL 5 MG PO TABS: 5.0000 mg | ORAL_TABLET | Freq: Four times a day (QID) | ORAL | Status: DC | PRN

## 2012-10-05 MED ORDER — THIAMINE HCL 100 MG/ML IJ SOLN
100.0000 mg | Freq: Every day | INTRAMUSCULAR | Status: DC
  Filled 2012-10-05: qty 1

## 2012-10-05 MED ORDER — ALBUTEROL SULFATE (5 MG/ML) 0.5% IN NEBU: 2.5000 mg | INHALATION_SOLUTION | RESPIRATORY_TRACT | Status: DC

## 2012-10-05 MED ORDER — LISINOPRIL 20 MG PO TABS
20.0000 mg | ORAL_TABLET | Freq: Every day | ORAL | Status: DC
  Filled 2012-10-05: qty 1

## 2012-10-05 MED ORDER — ALBUTEROL (5 MG/ML) CONTINUOUS INHALATION SOLN
INHALATION_SOLUTION | RESPIRATORY_TRACT | Status: AC
  Filled 2012-10-05: qty 20

## 2012-10-05 MED ORDER — ONDANSETRON HCL 4 MG PO TABS: 4.0000 mg | ORAL_TABLET | Freq: Four times a day (QID) | ORAL | Status: DC | PRN

## 2012-10-05 MED ORDER — OXYCODONE HCL ER 15 MG PO T12A: 30.0000 mg | EXTENDED_RELEASE_TABLET | Freq: Two times a day (BID) | ORAL | Status: DC

## 2012-10-05 MED ORDER — ASPIRIN EC 81 MG PO TBEC
81.0000 mg | DELAYED_RELEASE_TABLET | Freq: Every day | ORAL | Status: DC
  Filled 2012-10-05: qty 1

## 2012-10-05 MED ORDER — OXYCODONE-ACETAMINOPHEN 10-325 MG PO TABS: 1.0000 | ORAL_TABLET | Freq: Four times a day (QID) | ORAL | Status: DC | PRN

## 2012-10-05 MED ORDER — LORAZEPAM 0.5 MG PO TABS: 1.0000 mg | ORAL_TABLET | Freq: Four times a day (QID) | ORAL | Status: DC | PRN

## 2012-10-05 MED ORDER — METHYLPREDNISOLONE SODIUM SUCC 125 MG IJ SOLR
125.0000 mg | Freq: Once | INTRAMUSCULAR | Status: AC
  Administered 2012-10-05: 125 mg via INTRAVENOUS
  Filled 2012-10-05: qty 2

## 2012-10-05 MED ORDER — MOMETASONE FURO-FORMOTEROL FUM 100-5 MCG/ACT IN AERO
2.0000 | INHALATION_SPRAY | Freq: Two times a day (BID) | RESPIRATORY_TRACT | Status: DC
  Filled 2012-10-05: qty 8.8

## 2012-10-05 MED ORDER — ACETAMINOPHEN 325 MG PO TABS: 650.0000 mg | ORAL_TABLET | Freq: Four times a day (QID) | ORAL | Status: DC | PRN

## 2012-10-05 MED ORDER — MORPHINE SULFATE 4 MG/ML IJ SOLN
4.0000 mg | Freq: Once | INTRAMUSCULAR | Status: AC
  Administered 2012-10-05: 4 mg via INTRAVENOUS
  Filled 2012-10-05: qty 1

## 2012-10-05 MED ORDER — ALBUTEROL SULFATE (5 MG/ML) 0.5% IN NEBU
10.0000 mg | INHALATION_SOLUTION | Freq: Once | RESPIRATORY_TRACT | Status: AC
  Administered 2012-10-05: 10 mg via RESPIRATORY_TRACT

## 2012-10-05 MED ORDER — VITAMIN B-1 100 MG PO TABS
100.0000 mg | ORAL_TABLET | Freq: Every day | ORAL | Status: DC
  Filled 2012-10-05: qty 1

## 2012-10-05 MED ORDER — ADULT MULTIVITAMIN W/MINERALS CH
1.0000 | ORAL_TABLET | Freq: Every day | ORAL | Status: DC
  Filled 2012-10-05: qty 1

## 2012-10-05 MED ORDER — ACETAMINOPHEN 650 MG RE SUPP: 650.0000 mg | Freq: Four times a day (QID) | RECTAL | Status: DC | PRN

## 2012-10-05 MED ORDER — IPRATROPIUM BROMIDE 0.02 % IN SOLN
1.0000 mg | Freq: Once | RESPIRATORY_TRACT | Status: AC
  Administered 2012-10-05: 1 mg via RESPIRATORY_TRACT
  Filled 2012-10-05: qty 5

## 2012-10-05 MED ORDER — METHYLPREDNISOLONE SODIUM SUCC 125 MG IJ SOLR
125.0000 mg | Freq: Three times a day (TID) | INTRAMUSCULAR | Status: DC
  Filled 2012-10-05 (×3): qty 2

## 2012-10-05 MED ORDER — OXYCODONE HCL ER 30 MG PO T12A: 1.0000 | EXTENDED_RELEASE_TABLET | Freq: Two times a day (BID) | ORAL | Status: DC

## 2012-10-05 MED ORDER — GABAPENTIN 300 MG PO CAPS
600.0000 mg | ORAL_CAPSULE | Freq: Every day | ORAL | Status: DC
  Filled 2012-10-05: qty 2

## 2012-10-05 MED ORDER — LEVOFLOXACIN 750 MG PO TABS
750.0000 mg | ORAL_TABLET | Freq: Every day | ORAL | Status: DC
  Filled 2012-10-05: qty 1

## 2012-10-05 MED ORDER — POTASSIUM CHLORIDE CRYS ER 20 MEQ PO TBCR
40.0000 meq | EXTENDED_RELEASE_TABLET | Freq: Once | ORAL | Status: AC
  Administered 2012-10-05: 40 meq via ORAL
  Filled 2012-10-05: qty 2

## 2012-10-05 MED ORDER — FOLIC ACID 1 MG PO TABS
1.0000 mg | ORAL_TABLET | Freq: Every day | ORAL | Status: DC
  Filled 2012-10-05: qty 1

## 2012-10-05 MED ORDER — IPRATROPIUM BROMIDE 0.02 % IN SOLN: 0.5000 mg | RESPIRATORY_TRACT | Status: DC

## 2012-10-05 MED ORDER — MORPHINE SULFATE 2 MG/ML IJ SOLN
2.0000 mg | Freq: Once | INTRAMUSCULAR | Status: AC
  Administered 2012-10-05: 2 mg via INTRAVENOUS
  Filled 2012-10-05: qty 1

## 2012-10-05 MED ORDER — OXYCODONE-ACETAMINOPHEN 5-325 MG PO TABS: 1.0000 | ORAL_TABLET | Freq: Four times a day (QID) | ORAL | Status: DC | PRN

## 2012-10-05 MED ORDER — ENOXAPARIN SODIUM 40 MG/0.4ML ~~LOC~~ SOLN
40.0000 mg | SUBCUTANEOUS | Status: DC
  Filled 2012-10-05: qty 0.4

## 2012-10-05 NOTE — Progress Notes (Signed)
RT received order via EPIC to complete ABG for this PT. Upon arriving to PT room [facility] [id number]) RT was informed that PT left AMA. Therefore; ABG not completed.

## 2012-10-05 NOTE — ED Notes (Signed)
Report received, assumed care.  

## 2012-10-05 NOTE — Telephone Encounter (Addendum)
[name] left a voicemail at 2:33pm stating he was not doing well.  He went to the hospital last night.  His oxygen is down to 40%.  He can be reached at [phone].  Called [name] at 3:53 pm and left a message to call back.   [name] called back at 4:01pm.  He stated he is home and not doing well.  He was told by the hospital he had a few days left to live.  He would like Dr. Sharene Skeans to call him later if possible.

## 2012-10-05 NOTE — Telephone Encounter (Signed)
The patient is inebriated, depressed, and talking about "going home".  He does not want to return to the hospital and wants to die at home area at reviewed his medical record, and though he is sick, I don't think that he is seriously ill, though I can't be certain.  I told him that we would continue to be there for him.  He assured me that he was not considering suicide.  I told him to continue to take his medicines as prescribed and to call me Monday if he had further needs.

## 2012-10-05 NOTE — Progress Notes (Signed)
10/05/12 Patient is refusing to comply with MD orders regarding being NPO and being on a carb modified diet. Patient states is going to leave AMA because he is going to eat what he wants to eat and not follow MD orders. Anastasia Fiedler RN

## 2012-10-05 NOTE — ED Notes (Signed)
resp notified at this time for neb tx

## 2012-10-05 NOTE — ED Notes (Signed)
Patient laying on stretcher at this time, neb tx going. Patient complaining of nub pain from amputation and pain on buttocks due to ulcers. Patient states he is not being seen for ulcers, states they have been trying to clean at home. Rates pain 10/10. Patient states he has had increased fatigue over past couple weeks. States he had left leg amputated in January and has not had much activity since. Patients resp are even and unlabored. Family in room with patient. Call light at bedside. Will continue to monitor.

## 2012-10-05 NOTE — ED Notes (Signed)
POX 88% on 5L [place], resp e/u, no distress. Changed to venti mask 35%, will monitor

## 2012-10-05 NOTE — Progress Notes (Signed)
Utilization Review Completed Judianne Seiple J. Kamil Mchaffie, RN, BSN, NCM [phone]  

## 2012-10-05 NOTE — ED Notes (Signed)
Patient brought in by EMS, called for general weakness that has increased over the past few days. Patient also complaining of pain in buttocks area due to bed sores. Patient is supposed to wear o2 at home but does not use at home. Patient denies any shortness of breath. Patient is awake and oriented. NIH was negative per EMS.

## 2012-10-05 NOTE — ED Notes (Signed)
Internal Med doctor at bedside.
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
## 2012-10-05 NOTE — ED Provider Notes (Signed)
History     CSN: [id number]  Arrival date & time 10/05/12  0431   First MD Initiated Contact with Patient 10/05/12 [phone]      Chief Complaint  Patient presents with  . Fatigue    (Consider location/radiation/quality/duration/timing/severity/associated sxs/prior treatment) The history is provided by the patient.  [name] is a 57 y.o. male hx of CHF, COPD, obesity here with weakness, SOB. Diffuse weakness for the last 3 days. He stated that he just felt unwell. Has some mild SOB and stated that he "smokes like a train" daily. He is on home O2 but doesn't use it consistently. No fevers. + buttock pain from chronic bedsores. No cough.    Past Medical History  Diagnosis Date  . CHF (congestive heart failure)   . COPD (chronic obstructive pulmonary disease)   . Thoracic myelopathy   . Incontinence   . Insomnia   . Morbid obesity   . Spastic paraparesis   . Hypertension   . Heart murmur     was told yrs ago  . Shortness of breath     all the time  . Sleep apnea     set  at  9  . GERD (gastroesophageal reflux disease)     Past Surgical History  Procedure Laterality Date  . Baclofen infusion pump    . Eye surgery    . Cardiac catheterization    . Pain pump implantation  09/05/2011    Procedure: PAIN PUMP INSERTION;  Surgeon: Cristi Loron, MD;  Location: [facility];  Service: Neurosurgery;  Laterality: N/A;  Baclofen Pump Replacement    Family History  Problem Relation Age of Onset  . Cancer Father 58  . Emphysema Mother 53    History  Substance Use Topics  . Smoking status: Current Every Day Smoker -- 1.00 packs/day for 38 years    Types: Cigarettes  . Smokeless tobacco: Not on file     Comment: Quit recent.  . Alcohol Use: 28.0 oz/week    56 drink(s) per week      Review of Systems  Respiratory: Positive for shortness of breath.   Neurological: Positive for weakness.  All other systems reviewed and are negative.    Allergies   Diamox  Home Medications   Current Outpatient Rx  Name  Route  Sig  Dispense  Refill  . Fluticasone-Salmeterol (ADVAIR) 250-50 MCG/DOSE AEPB   Inhalation   Inhale 1 puff into the lungs 2 (two) times daily.         Marland Kitchen gabapentin (NEURONTIN) 300 MG capsule   Oral   Take 300-600 mg by mouth at bedtime as needed (for pain).          Marland Kitchen glimepiride (AMARYL) 2 MG tablet   Oral   Take 2 mg by mouth 2 (two) times daily.         Marland Kitchen lisinopril-hydrochlorothiazide (PRINZIDE,ZESTORETIC) 20-25 MG per tablet   Oral   Take 1 tablet by mouth daily.         Marland Kitchen LORazepam (ATIVAN) 2 MG tablet   Oral   Take 2 mg by mouth 2 (two) times daily. For anxiety         . metFORMIN (GLUCOPHAGE) 500 MG tablet   Oral   Take 500 mg by mouth 2 (two) times daily with a meal.         . oxyCODONE-acetaminophen (PERCOCET) 10-325 MG per tablet      Take 1 tablet 4 times per  day as needed for pain. Dx 724.4   124 tablet   0   . OXYCONTIN 30 MG T12A   Oral   Take 1 tablet by mouth 2 (two) times daily.          Marland Kitchen zolpidem (AMBIEN) 10 MG tablet   Oral   Take 10 mg by mouth at bedtime as needed for sleep.           Pulse 88  Temp(Src) 98.3 F (36.8 C) (Oral)  Resp 20  SpO2 96%  Physical Exam  Nursing note and vitals reviewed. Constitutional: He is oriented to person, place, and time.  Smells of cigarettes. Tired. Smells of urine.   HENT:  Head: Normocephalic.  Mouth/Throat: Oropharynx is clear and moist.  Eyes: Conjunctivae are normal. Pupils are equal, round, and reactive to light.  Neck: Normal range of motion. Neck supple.  Cardiovascular: Normal rate, regular rhythm and normal heart sounds.   Pulmonary/Chest:  Slightly tachypneic, no wheezing but poor air movements bilaterally   Abdominal: Soft.  Obese, nontender   Musculoskeletal:  L AKA   Neurological: He is alert and oriented to person, place, and time.  Skin: Skin is warm and dry.  Stage 1 sacral decub ulcers   Psychiatric: He has a normal mood and affect. His behavior is normal. Judgment and thought content normal.    ED Course  Procedures (including critical care time)  Labs Reviewed  CBC WITH DIFFERENTIAL - Abnormal; Notable for the following:    RDW 16.3 (*)    All other components within normal limits  COMPREHENSIVE METABOLIC PANEL - Abnormal; Notable for the following:    Potassium 3.1 (*)    Chloride 94 (*)    Glucose, Bld 108 (*)    Albumin 3.0 (*)    AST 71 (*)    Alkaline Phosphatase 240 (*)    All other components within normal limits  URINALYSIS, ROUTINE W REFLEX MICROSCOPIC - Abnormal; Notable for the following:    APPearance CLOUDY (*)    Hgb urine dipstick SMALL (*)    Nitrite POSITIVE (*)    Leukocytes, UA MODERATE (*)    All other components within normal limits  URINE MICROSCOPIC-ADD ON - Abnormal; Notable for the following:    Bacteria, UA MANY (*)    All other components within normal limits  URINE CULTURE  TROPONIN I  PRO B NATRIURETIC PEPTIDE   Dg Chest Portable 1 View  10/05/2012  *RADIOLOGY REPORT*  Clinical Data: Shortness of breath.  PORTABLE CHEST - 1 VIEW  Comparison: Chest radiograph performed 08/29/2011  Findings: The lungs are well-aerated.  Mild vascular congestion is noted.  Minimal bibasilar opacities likely reflect atelectasis, though minimal interstitial edema could have a similar appearance. There is no evidence of pleural effusion or pneumothorax.  The cardiomediastinal silhouette is mildly enlarged.  No acute osseous abnormalities are seen.  IMPRESSION: Mild vascular congestion and cardiomegaly; minimal bibasilar opacities likely reflect atelectasis, though minimal interstitial edema could have a similar appearance.   Original Report Authenticated By: Tonia Ghent, M.D.      No diagnosis found.   Date: 10/05/2012  Rate: 92  Rhythm: normal sinus rhythm  QRS Axis: normal  Intervals: normal  ST/T Wave abnormalities: nonspecific ST changes   Conduction Disutrbances:right bundle branch block and left anterior fascicular block  Narrative Interpretation:   Old EKG Reviewed: unchanged    MDM  [name] is a 57 y.o. male here with SOB, weakness. Will consider sepsis  vs COPD vs CHF exacerbation. Will give nebs and steroids and check BNP. Will get CXR and UA. Will reassess.   6:15 AM UA + UTI, given ceftriaxone. CXR unremarkable. Now started wheezing after nebs and steroids. I called Dr. Arbutus Leas, who will admit the patient.         Richardean Canal, MD 10/05/12 [phone]

## 2012-10-06 LAB — URINE CULTURE: Colony Count: >=100000

## 2012-10-08 ENCOUNTER — Telehealth: Payer: Self-pay | Admitting: *Deleted

## 2012-10-08 DIAGNOSIS — G839 Paralytic syndrome, unspecified: Secondary | ICD-10-CM

## 2012-10-08 DIAGNOSIS — S98919A Complete traumatic amputation of unspecified foot, level unspecified, initial encounter: Secondary | ICD-10-CM

## 2012-10-08 DIAGNOSIS — J449 Chronic obstructive pulmonary disease, unspecified: Secondary | ICD-10-CM

## 2012-10-08 NOTE — Telephone Encounter (Signed)
Oh that's great [name]. If you need for me to do anything please let me know. Thanks, [name].

## 2012-10-08 NOTE — Telephone Encounter (Signed)
I spoke with Martie Lee the patient's wife and she needs a referral for Hospice to come out to help with the patient's needs, he was told that he is dying and Tammy Sours wants to die at home but his wife needs support and help with this. Martie Lee can be reached on her phone at [phone] or on Greg's phone at [phone]. Thanks, [name].

## 2012-10-08 NOTE — Telephone Encounter (Signed)
I called [facility]. They need referral, last office note and signed order faxed to [phone]. I will fax those items to Hospice.

## 2012-10-09 NOTE — Telephone Encounter (Signed)
Hospice will be coming out to the patient's home today 10/09/12. Michelle B.

## 2012-10-11 ENCOUNTER — Encounter: Payer: 59 | Admitting: Pediatrics

## 2012-10-11 ENCOUNTER — Encounter: Payer: Self-pay | Admitting: Pediatrics

## 2012-10-12 ENCOUNTER — Encounter: Payer: Self-pay | Admitting: Pediatrics

## 2012-10-12 ENCOUNTER — Ambulatory Visit (INDEPENDENT_AMBULATORY_CARE_PROVIDER_SITE_OTHER): Payer: 59 | Admitting: Pediatrics

## 2012-10-12 DIAGNOSIS — M62838 Other muscle spasm: Secondary | ICD-10-CM

## 2012-10-12 DIAGNOSIS — G373 Acute transverse myelitis in demyelinating disease of central nervous system: Secondary | ICD-10-CM

## 2012-10-12 DIAGNOSIS — G0489 Other myelitis: Secondary | ICD-10-CM

## 2012-10-12 NOTE — Progress Notes (Signed)
Reason for visit:  ongoing evaluation and management of spasticity and  chronic pain syndrome: emptying refilling and reprogramming intrathecal baclofen pump  Chief Complaint: Chronic weakness, spasticity, and pain from transverse myelitis  Mr. Sedlacek returns today for emptying, refilling, and reprogramming his intrathecal baclofen pump.  He has transverse myelitis.  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  The patient is smoking heavily.  He is also drinking heavily.  He takes alcohol with his pain medicines, which dulls his pain and totally impairs his sensorium.  For quite some time, I thought that he has lost his will to live.  He is wheelchair bound.  He had to quit his job or he has been fired.  He has lost his home, though he recently purchased a Zenaida Niece so he could get from one place to another.  He has severe chronic obstructive pulmonary disease.  His pain is chronic neuritic in nature.  I think that he has pain in his back from spasticity.  He has idiopathic transverse myelitis.  He recently requested care from hospice.  He refused to come to his scheduled appointment yesterday to empty, refill, and reprogram his pump, but his wife thought that she heard the pump alarming and brought him in today.  He was stuporous.  I had a great deal of difficulty getting access to his reservoir, but after over 30 attempts was finally able to enter the reservoir, empty it, refill it, and reprogram the device.  Fortunately, he was here with a long-time friend who was able to help me get him up onto the exam table for better access.  The patient is morbidly obese, has type II diabetes mellitus, and vascular insufficiency of his left leg.  He had an above-the-knee amputation within the past week.  This  is healing quite well.  He has phantom limb pain.  He was in a stupor today having mixed alcohol with his pain medicine . Procedure interrogation, emptying, refilling and reprogramming of intrathecal baclofen pump.  His new pump was placed September 05, 2011.  Serial number [id number], the model [id number]  Concentration of baclofen is 2000 mcg/mL.  Basal rate 30.0  Mcg per hour.  He has 2 -  60 mcg bolus doses at 5 AM, 11 AM; 2 -  80 mcg  at 5 PM, and 11 PM.    Total daily dose 969.5  mcg.  He told me that he still has significant problems with spasticity at nighttime when he tries to get out of bed.  He is getting weaker, and I am unwilling to increase his baclofen for fear of accelerating that process.  I had great difficulty entering the pump.  The reservoir was entered atraumatically.  5.5 mL was withdrawn placing the pump under partial vacuum.  Through a Millipore filter 40 mL of baclofen, concentration 2000 mcg/mL was instilled.  The pump was reprogrammed to reflect the new volume and unchanged dosing rate.  Refill interval is 78 days, battery life is 67 months.  He tolerated the procedure well.  He will return on or before December 29, 2012.  Impression: 1. Transverse myelitis with spasticity and paraplegia, urinary incontinence 2.  multiple medical problems 3. Severe depression   The patient is scheduled to return December 29, 2012.  I did not make any changes in his pump, because he is not showing increased signs of spasticity, therefore, there was nothing to be gained.  After his last visit to the office on September 25, 2012, I attempted to involve the [facility] at home.  I think that with hospice he will obtain care that he needs.  Deetta Perla MD

## 2012-10-12 NOTE — Patient Instructions (Signed)
I will pray for you and hope to see you in July.

## 2012-10-13 ENCOUNTER — Encounter: Payer: Self-pay | Admitting: Pediatrics

## 2012-10-13 ENCOUNTER — Observation Stay: Payer: Self-pay | Admitting: Internal Medicine

## 2012-10-13 LAB — BASIC METABOLIC PANEL
Anion Gap: 11 (ref 7–16)
Calcium, Total: 8.4 mg/dL — ABNORMAL LOW (ref 8.5–10.1)
Chloride: 74 mmol/L — ABNORMAL LOW (ref 98–107)
Chloride: 73 mmol/L — ABNORMAL LOW (ref 98–107)
Co2: 33 mmol/L — ABNORMAL HIGH (ref 21–32)
Creatinine: 0.79 mg/dL (ref 0.60–1.30)
EGFR (African American): >60
EGFR (Non-African Amer.): >60
Glucose: 113 mg/dL — ABNORMAL HIGH (ref 65–99)
Osmolality: 237 (ref 275–301)
Potassium: 2.4 mmol/L — CL (ref 3.5–5.1)
Potassium: 2.5 mmol/L — CL (ref 3.5–5.1)
Sodium: 115 mmol/L — CL (ref 136–145)
Sodium: 117 mmol/L — CL (ref 136–145)

## 2012-10-13 LAB — CBC
HCT: 44.5 % (ref 40.0–52.0)
MCV: 88 fL (ref 80–100)
RDW: 15.9 % — ABNORMAL HIGH (ref 11.5–14.5)

## 2012-10-13 LAB — CK TOTAL AND CKMB (NOT AT ARMC): CK-MB: 8.3 ng/mL — ABNORMAL HIGH (ref 0.5–3.6)

## 2012-10-13 LAB — MAGNESIUM: Magnesium: 1.1 mg/dL — ABNORMAL LOW

## 2012-10-14 LAB — BASIC METABOLIC PANEL
Calcium, Total: 8.6 mg/dL (ref 8.5–10.1)
Chloride: 76 mmol/L — ABNORMAL LOW (ref 98–107)
Co2: 31 mmol/L (ref 21–32)
Creatinine: 0.77 mg/dL (ref 0.60–1.30)
EGFR (African American): >60
EGFR (Non-African Amer.): >60
Glucose: 105 mg/dL — ABNORMAL HIGH (ref 65–99)
Potassium: 2.5 mmol/L — CL (ref 3.5–5.1)
Sodium: 118 mmol/L — CL (ref 136–145)

## 2012-10-14 LAB — CBC WITH DIFFERENTIAL/PLATELET
Basophil #: 0.1 10*3/uL (ref 0.0–0.1)
Basophil %: 0.7 %
Eosinophil #: 0.1 10*3/uL (ref 0.0–0.7)
Lymphocyte #: 1.3 10*3/uL (ref 1.0–3.6)
Lymphocyte %: 10.6 %
MCH: 30.6 pg (ref 26.0–34.0)
MCV: 88 fL (ref 80–100)
Monocyte %: 7.8 %
Neutrophil #: 10 10*3/uL — ABNORMAL HIGH (ref 1.4–6.5)
Neutrophil %: 80.5 %
Platelet: 247 10*3/uL (ref 150–440)
RBC: 4.76 10*6/uL (ref 4.40–5.90)
RDW: 16.1 % — ABNORMAL HIGH (ref 11.5–14.5)
WBC: 12.4 10*3/uL — ABNORMAL HIGH (ref 3.8–10.6)

## 2012-10-14 LAB — TROPONIN I: Troponin-I: <0.02 ng/mL

## 2012-10-15 ENCOUNTER — Encounter: Payer: Self-pay | Admitting: *Deleted

## 2012-10-16 ENCOUNTER — Other Ambulatory Visit: Payer: Self-pay

## 2012-10-16 DIAGNOSIS — G839 Paralytic syndrome, unspecified: Secondary | ICD-10-CM

## 2012-10-16 DIAGNOSIS — M79609 Pain in unspecified limb: Secondary | ICD-10-CM

## 2012-10-16 DIAGNOSIS — IMO0002 Reserved for concepts with insufficient information to code with codable children: Secondary | ICD-10-CM

## 2012-10-16 DIAGNOSIS — G2569 Other tics of organic origin: Secondary | ICD-10-CM

## 2012-10-16 DIAGNOSIS — M4714 Other spondylosis with myelopathy, thoracic region: Secondary | ICD-10-CM

## 2012-10-16 MED ORDER — LORAZEPAM 2 MG PO TABS: ORAL_TABLET | ORAL | Status: DC

## 2012-10-16 MED ORDER — OXYCODONE-ACETAMINOPHEN 10-325 MG PO TABS: ORAL_TABLET | ORAL | Status: DC

## 2012-10-16 NOTE — Telephone Encounter (Signed)
[name] called and lvm asking for refills

## 2012-10-18 ENCOUNTER — Other Ambulatory Visit: Payer: Self-pay

## 2012-10-18 ENCOUNTER — Inpatient Hospital Stay: Payer: Self-pay | Admitting: Family Medicine

## 2012-10-18 DIAGNOSIS — G4700 Insomnia, unspecified: Secondary | ICD-10-CM

## 2012-10-18 LAB — COMPREHENSIVE METABOLIC PANEL
Alkaline Phosphatase: 184 U/L — ABNORMAL HIGH (ref 50–136)
Anion Gap: 13 (ref 7–16)
Bilirubin,Total: 0.7 mg/dL (ref 0.2–1.0)
Calcium, Total: 7.5 mg/dL — ABNORMAL LOW (ref 8.5–10.1)
Chloride: 78 mmol/L — ABNORMAL LOW (ref 98–107)
Co2: 28 mmol/L (ref 21–32)
Creatinine: 0.83 mg/dL (ref 0.60–1.30)
EGFR (Non-African Amer.): >60
Glucose: 99 mg/dL (ref 65–99)
Osmolality: 241 (ref 275–301)
Potassium: 2.4 mmol/L — CL (ref 3.5–5.1)
SGOT(AST): 111 U/L — ABNORMAL HIGH (ref 15–37)
SGPT (ALT): 59 U/L (ref 12–78)
Sodium: 119 mmol/L — CL (ref 136–145)

## 2012-10-18 LAB — CBC WITH DIFFERENTIAL/PLATELET
Basophil %: 0.4 %
Eosinophil %: 0.8 %
Lymphocyte #: 1.5 10*3/uL (ref 1.0–3.6)
Lymphocyte %: 9 %
MCH: 30.6 pg (ref 26.0–34.0)
MCV: 88 fL (ref 80–100)
Monocyte #: 1.1 x10 3/mm — ABNORMAL HIGH (ref 0.2–1.0)
Monocyte %: 6.8 %
Neutrophil #: 13.5 10*3/uL — ABNORMAL HIGH (ref 1.4–6.5)
Platelet: 338 10*3/uL (ref 150–440)
RBC: 4.61 10*6/uL (ref 4.40–5.90)
RDW: 15.9 % — ABNORMAL HIGH (ref 11.5–14.5)
WBC: 16.3 10*3/uL — ABNORMAL HIGH (ref 3.8–10.6)

## 2012-10-18 LAB — URINALYSIS, COMPLETE
Bilirubin,UR: NEGATIVE
Leukocyte Esterase: NEGATIVE
Nitrite: NEGATIVE
Protein: NEGATIVE
RBC,UR: NONE SEEN /HPF (ref 0–5)
Squamous Epithelial: NONE SEEN

## 2012-10-18 LAB — CK TOTAL AND CKMB (NOT AT ARMC)
CK, Total: 993 U/L — ABNORMAL HIGH (ref 35–232)
CK-MB: 16.1 ng/mL — ABNORMAL HIGH (ref 0.5–3.6)

## 2012-10-18 LAB — TROPONIN I: Troponin-I: <0.02 ng/mL

## 2012-10-18 LAB — PRO B NATRIURETIC PEPTIDE: B-Type Natriuretic Peptide: 93 pg/mL (ref 0–125)

## 2012-10-18 LAB — PHOSPHORUS: Phosphorus: 2.1 mg/dL — ABNORMAL LOW (ref 2.5–4.9)

## 2012-10-18 LAB — MAGNESIUM: Magnesium: 1.5 mg/dL — ABNORMAL LOW

## 2012-10-18 MED ORDER — ZOLPIDEM TARTRATE 10 MG PO TABS: 10.0000 mg | ORAL_TABLET | Freq: Every evening | ORAL | Status: DC | PRN

## 2012-10-19 LAB — BASIC METABOLIC PANEL
Anion Gap: 7 (ref 7–16)
Chloride: 83 mmol/L — ABNORMAL LOW (ref 98–107)
EGFR (African American): >60
EGFR (Non-African Amer.): >60
Glucose: 115 mg/dL — ABNORMAL HIGH (ref 65–99)
Potassium: 2.4 mmol/L — CL (ref 3.5–5.1)
Sodium: 124 mmol/L — ABNORMAL LOW (ref 136–145)

## 2012-10-19 LAB — CK TOTAL AND CKMB (NOT AT ARMC)
CK, Total: 674 U/L — ABNORMAL HIGH (ref 35–232)
CK-MB: 8.2 ng/mL — ABNORMAL HIGH (ref 0.5–3.6)

## 2012-10-19 LAB — WBC: WBC: 10.9 10*3/uL — ABNORMAL HIGH (ref 3.8–10.6)

## 2012-10-20 LAB — BASIC METABOLIC PANEL
Anion Gap: 5 — ABNORMAL LOW (ref 7–16)
Calcium, Total: 9 mg/dL (ref 8.5–10.1)
Chloride: 93 mmol/L — ABNORMAL LOW (ref 98–107)
Co2: 35 mmol/L — ABNORMAL HIGH (ref 21–32)
EGFR (African American): >60
Glucose: 95 mg/dL (ref 65–99)
Potassium: 3.2 mmol/L — ABNORMAL LOW (ref 3.5–5.1)

## 2012-10-20 LAB — URINE CULTURE

## 2012-10-21 LAB — CBC WITH DIFFERENTIAL/PLATELET
Basophil #: 0.1 10*3/uL (ref 0.0–0.1)
Basophil %: 0.9 %
Eosinophil #: 0.2 10*3/uL (ref 0.0–0.7)
Eosinophil %: 2.5 %
HCT: 38.6 % — ABNORMAL LOW (ref 40.0–52.0)
HGB: 12.7 g/dL — ABNORMAL LOW (ref 13.0–18.0)
Lymphocyte #: 2 10*3/uL (ref 1.0–3.6)
Lymphocyte %: 28 %
MCHC: 32.9 g/dL (ref 32.0–36.0)
MCV: 91 fL (ref 80–100)
Monocyte #: 0.7 x10 3/mm (ref 0.2–1.0)
Neutrophil #: 4.2 10*3/uL (ref 1.4–6.5)
RBC: 4.23 10*6/uL — ABNORMAL LOW (ref 4.40–5.90)
RDW: 15.9 % — ABNORMAL HIGH (ref 11.5–14.5)

## 2012-10-21 LAB — BASIC METABOLIC PANEL
Calcium, Total: 8.9 mg/dL (ref 8.5–10.1)
Co2: 30 mmol/L (ref 21–32)
EGFR (Non-African Amer.): >60
Glucose: 44 mg/dL — ABNORMAL LOW (ref 65–99)
Osmolality: 266 (ref 275–301)
Potassium: 3 mmol/L — ABNORMAL LOW (ref 3.5–5.1)

## 2012-10-22 ENCOUNTER — Encounter (HOSPITAL_COMMUNITY): Payer: Self-pay | Admitting: Internal Medicine

## 2012-10-22 DIAGNOSIS — G373 Acute transverse myelitis in demyelinating disease of central nervous system: Secondary | ICD-10-CM | POA: Insufficient documentation

## 2012-10-22 NOTE — Discharge Summary (Signed)
[facility]   ASHANTI LITTLES WUJ:[id number] DOB: 30-Nov-1955 DOA: 10/05/2012   PCP: Lyndon Code, MD   Chief Complaint: stump pain and sob  THIS PATIENT LEFT THE HOSPITAL AGAINST MEDICAL ADVICE ON 10/05/12.  I ADMITTED THE PATIENT EARLIER ON 10/05/12.  THIS NOTE IS WRITTEN SOLEY TO SATISFY PURPOSE OF HAVING A D/C SUMMARY ON EPIC.  I WAS NOT CONTACTED NOR DID I HAVE FURTHER INTERACTION WITH THE PATIENT AFTER INITIAL ADMISSION ENCOUNTER.  THE ASSESSMENT AND PLAN ARE CARRIED OVER FROM MY INITIAL ENCOUNTER WITH THE PATIENT   Assessment/Plan: COPD exacerbation -Aerosolized albuterol and Atrovent -Solu-Medrol 125 mg every 8 hours -Start by mouth Levaquin -Obtain echocardiogram as I suspect that the patient has a degree of cor pulmonale as mentioned into his shortness of breath -Tobacco cessation discussed -Cycle troponins -obtain ABG -oxygen sat 91-93% on 3 liters [place] at time of my exam Alcohol abuse -Place patient on CIWA protocol -start thiamine -This is likely the cause for the patient's elevated LFTs however abdominal ultrasound has been ordered Diabetes mellitus type 2 -Discontinue Amaryl and metformin -Start NovoLog sliding scale -Hemoglobin A1C Stage I-II sacral decubiti -Examination reveals that they are NOT infected -Consult Wound Care nurse Hypokalemia -Replace -Check magnesium Pyuria -Urine culture has been obtained in the emergency department -Start patient  on Levaquin which will cover his COPD exacerbation as well as his urine Chronic pain -Continue OxyContin 30 mg twice a day -Continue Percocet 10/325 every 6 hours PRN pain       Past Medical History  Diagnosis Date  . CHF (congestive heart failure)   . COPD (chronic obstructive pulmonary disease)   . Thoracic myelopathy   . Incontinence   . Insomnia   . Morbid obesity   . Spastic paraparesis   . Hypertension   . Heart murmur     was told yrs ago  . Shortness of breath     all the time  .  Sleep apnea     set  at  9  . GERD (gastroesophageal reflux disease)    Past Surgical History  Procedure Laterality Date  . Baclofen infusion pump    . Eye surgery    . Cardiac catheterization    . Pain pump implantation  09/05/2011    Procedure: PAIN PUMP INSERTION;  Surgeon: Cristi Loron, MD;  Location: [facility];  Service: Neurosurgery;  Laterality: N/A;  Baclofen Pump Replacement   Social History:  reports that he has been smoking Cigarettes.  He has a 38 pack-year smoking history. He does not have any smokeless tobacco history on file. He reports that he drinks about 28.0 ounces of alcohol per week. He reports that he does not use illicit drugs.   Family History  Problem Relation Age of Onset  . Cancer Father 37  . Emphysema Mother 11     Allergies  Allergen Reactions  . Diamox (Acetazolamide) Rash      Prior to Admission medications   Medication Sig Start Date End Date Taking? Authorizing Provider  Fluticasone-Salmeterol (ADVAIR) 250-50 MCG/DOSE AEPB Inhale 1 puff into the lungs 2 (two) times daily.   Yes Historical Provider, MD  gabapentin (NEURONTIN) 300 MG capsule Take 300-600 mg by mouth at bedtime as needed (for pain).    Yes Historical Provider, MD  glimepiride (AMARYL) 2 MG tablet Take 2 mg by mouth 2 (two) times daily.   Yes Historical Provider, MD  lisinopril-hydrochlorothiazide (PRINZIDE,ZESTORETIC) 20-25 MG per tablet Take 1 tablet by mouth  daily.   Yes Historical Provider, MD  LORazepam (ATIVAN) 2 MG tablet Take 2 mg by mouth 2 (two) times daily. For anxiety   Yes Historical Provider, MD  metFORMIN (GLUCOPHAGE) 500 MG tablet Take 500 mg by mouth 2 (two) times daily with a meal.   Yes Historical Provider, MD  oxyCODONE-acetaminophen (PERCOCET) 10-325 MG per tablet Take 1 tablet 4 times per day as needed for pain. Dx 724.4 09/20/12  Yes Elveria Rising, NP  OXYCONTIN 30 MG T12A Take 1 tablet by mouth 2 (two) times daily.  08/10/12  Yes Historical Provider,  MD  zolpidem (AMBIEN) 10 MG tablet Take 10 mg by mouth at bedtime as needed for sleep.   Yes Historical Provider, MD      Labs on Admission:  Basic Metabolic Panel: No results found for this basename: NA, K, CL, CO2, GLUCOSE, BUN, CREATININE, CALCIUM, MG, PHOS,  in the last 168 hours Liver Function Tests: No results found for this basename: AST, ALT, ALKPHOS, BILITOT, PROT, ALBUMIN,  in the last 168 hours No results found for this basename: LIPASE, AMYLASE,  in the last 168 hours No results found for this basename: AMMONIA,  in the last 168 hours CBC: No results found for this basename: WBC, NEUTROABS, HGB, HCT, MCV, PLT,  in the last 168 hours Cardiac Enzymes: No results found for this basename: CKTOTAL, CKMB, CKMBINDEX, TROPONINI,  in the last 168 hours BNP: No components found with this basename: POCBNP,  CBG: No results found for this basename: GLUCAP,  in the last 168 hours  Radiological Exams on Admission: No results found.    Angel Hobdy, DO  [facility] Pager [phone]  If 7PM-7AM, please contact night-coverage [email or web address] Password [facility] 10/22/2012, 9:37 AM

## 2012-10-24 LAB — CULTURE, BLOOD (SINGLE)

## 2012-10-31 ENCOUNTER — Telehealth: Payer: Self-pay | Admitting: *Deleted

## 2012-10-31 DIAGNOSIS — G373 Acute transverse myelitis in demyelinating disease of central nervous system: Secondary | ICD-10-CM

## 2012-10-31 DIAGNOSIS — IMO0002 Reserved for concepts with insufficient information to code with codable children: Secondary | ICD-10-CM

## 2012-10-31 DIAGNOSIS — S88119A Complete traumatic amputation at level between knee and ankle, unspecified lower leg, initial encounter: Secondary | ICD-10-CM

## 2012-10-31 NOTE — Telephone Encounter (Signed)
Tammy Sours called and stated that he needs a Rx script for a new power chair, he asked that he be called back to discuss this matter at [phone]. Thanks, MB

## 2012-10-31 NOTE — Telephone Encounter (Signed)
I called and spoke to [place]. He said that he was renting a power chair that was costing more than he could pay. He had talked to [employer] @ [phone] who said that they needed an Rx and could get him a power chair. Their fax is [phone]. I told Tammy Sours that I will call the company tomorrow to find out what needs to be on the script and send it in. I will call him when done.

## 2012-11-01 NOTE — Telephone Encounter (Signed)
I called [employer]. They said that a simple order, signed by Dr Sharene Skeans would suffice. I will fax the order as requested. I called Tammy Sours to let him know.

## 2012-11-05 ENCOUNTER — Other Ambulatory Visit: Payer: Self-pay | Admitting: Family

## 2012-11-05 DIAGNOSIS — G4700 Insomnia, unspecified: Secondary | ICD-10-CM

## 2012-11-05 MED ORDER — ZOLPIDEM TARTRATE 10 MG PO TABS: ORAL_TABLET | ORAL | Status: DC

## 2012-11-07 ENCOUNTER — Other Ambulatory Visit: Payer: Self-pay | Admitting: Family

## 2012-11-07 DIAGNOSIS — IMO0002 Reserved for concepts with insufficient information to code with codable children: Secondary | ICD-10-CM

## 2012-11-07 DIAGNOSIS — M79609 Pain in unspecified limb: Secondary | ICD-10-CM

## 2012-11-07 DIAGNOSIS — M4714 Other spondylosis with myelopathy, thoracic region: Secondary | ICD-10-CM

## 2012-11-07 DIAGNOSIS — G2569 Other tics of organic origin: Secondary | ICD-10-CM

## 2012-11-07 DIAGNOSIS — G839 Paralytic syndrome, unspecified: Secondary | ICD-10-CM

## 2012-11-07 MED ORDER — LORAZEPAM 2 MG PO TABS: ORAL_TABLET | ORAL | Status: DC

## 2012-11-12 ENCOUNTER — Telehealth: Payer: Self-pay | Admitting: *Deleted

## 2012-11-12 NOTE — Telephone Encounter (Signed)
The wheelchair order was faxed to [place] at [facility] as requested. TG

## 2012-11-12 NOTE — Telephone Encounter (Signed)
[name] called and stated that the company handling his wheelchair request is [employer] and the contact person is [place], all previously submitted info needs to be resubmitted to [place] fax number [phone] and phone number is [phone]. Thanks, [name].

## 2012-11-19 LAB — COMPREHENSIVE METABOLIC PANEL
Albumin: 3.3 g/dL — ABNORMAL LOW (ref 3.4–5.0)
Alkaline Phosphatase: 135 U/L (ref 50–136)
Anion Gap: 10 (ref 7–16)
BUN: 8 mg/dL (ref 7–18)
Calcium, Total: 9.7 mg/dL (ref 8.5–10.1)
Co2: 22 mmol/L (ref 21–32)
Creatinine: 0.61 mg/dL (ref 0.60–1.30)
EGFR (African American): >60
EGFR (Non-African Amer.): >60
Glucose: 89 mg/dL (ref 65–99)
Osmolality: 275 (ref 275–301)
Potassium: 3.4 mmol/L — ABNORMAL LOW (ref 3.5–5.1)
SGPT (ALT): 51 U/L (ref 12–78)
Sodium: 139 mmol/L (ref 136–145)
Total Protein: 7.2 g/dL (ref 6.4–8.2)

## 2012-11-19 LAB — DRUG SCREEN, URINE
Barbiturates, Ur Screen: NEGATIVE (ref ?–200)
Cocaine Metabolite,Ur ~~LOC~~: NEGATIVE (ref ?–300)
MDMA (Ecstasy)Ur Screen: NEGATIVE (ref ?–500)
Methadone, Ur Screen: NEGATIVE (ref ?–300)
Opiate, Ur Screen: NEGATIVE (ref ?–300)
Phencyclidine (PCP) Ur S: NEGATIVE (ref ?–25)

## 2012-11-19 LAB — URINALYSIS, COMPLETE
Nitrite: NEGATIVE
Ph: 6 (ref 4.5–8.0)
Protein: NEGATIVE
Specific Gravity: 1.002 (ref 1.003–1.030)
Squamous Epithelial: <1
WBC UR: 31 /HPF (ref 0–5)

## 2012-11-19 LAB — CBC
MCH: 30.3 pg (ref 26.0–34.0)
MCV: 90 fL (ref 80–100)
Platelet: 314 10*3/uL (ref 150–440)
RDW: 15.8 % — ABNORMAL HIGH (ref 11.5–14.5)
WBC: 12 10*3/uL — ABNORMAL HIGH (ref 3.8–10.6)

## 2012-11-20 ENCOUNTER — Inpatient Hospital Stay: Payer: Self-pay | Admitting: Specialist

## 2012-11-21 ENCOUNTER — Other Ambulatory Visit: Payer: Self-pay | Admitting: Family

## 2012-11-21 DIAGNOSIS — IMO0002 Reserved for concepts with insufficient information to code with codable children: Secondary | ICD-10-CM

## 2012-11-21 DIAGNOSIS — M79609 Pain in unspecified limb: Secondary | ICD-10-CM

## 2012-11-21 DIAGNOSIS — M4714 Other spondylosis with myelopathy, thoracic region: Secondary | ICD-10-CM

## 2012-11-21 DIAGNOSIS — G839 Paralytic syndrome, unspecified: Secondary | ICD-10-CM

## 2012-11-21 MED ORDER — OXYCODONE-ACETAMINOPHEN 10-325 MG PO TABS: ORAL_TABLET | ORAL | Status: DC

## 2012-11-21 NOTE — Telephone Encounter (Signed)
Patient requested to pick up Rx for Oxycodone. Will put at front desk for him to pick up. TG

## 2012-11-22 ENCOUNTER — Encounter: Payer: Self-pay | Admitting: Family

## 2012-11-22 ENCOUNTER — Emergency Department (HOSPITAL_COMMUNITY)
Admission: EM | Admit: 2012-11-22 | Discharge: 2012-11-24 | Disposition: A | Discharge status: Home / Self Care | Payer: 59 | Attending: Emergency Medicine | Admitting: Emergency Medicine

## 2012-11-22 ENCOUNTER — Encounter (HOSPITAL_COMMUNITY): Payer: Self-pay

## 2012-11-22 DIAGNOSIS — T43502A Poisoning by unspecified antipsychotics and neuroleptics, intentional self-harm, initial encounter: Secondary | ICD-10-CM | POA: Insufficient documentation

## 2012-11-22 DIAGNOSIS — Z79899 Other long term (current) drug therapy: Secondary | ICD-10-CM | POA: Insufficient documentation

## 2012-11-22 DIAGNOSIS — Z8709 Personal history of other diseases of the respiratory system: Secondary | ICD-10-CM | POA: Insufficient documentation

## 2012-11-22 DIAGNOSIS — I10 Essential (primary) hypertension: Secondary | ICD-10-CM | POA: Insufficient documentation

## 2012-11-22 DIAGNOSIS — T1491XA Suicide attempt, initial encounter: Secondary | ICD-10-CM

## 2012-11-22 DIAGNOSIS — Z8669 Personal history of other diseases of the nervous system and sense organs: Secondary | ICD-10-CM | POA: Insufficient documentation

## 2012-11-22 DIAGNOSIS — J449 Chronic obstructive pulmonary disease, unspecified: Secondary | ICD-10-CM | POA: Insufficient documentation

## 2012-11-22 DIAGNOSIS — G4700 Insomnia, unspecified: Secondary | ICD-10-CM | POA: Insufficient documentation

## 2012-11-22 DIAGNOSIS — T424X4A Poisoning by benzodiazepines, undetermined, initial encounter: Secondary | ICD-10-CM | POA: Insufficient documentation

## 2012-11-22 DIAGNOSIS — F17200 Nicotine dependence, unspecified, uncomplicated: Secondary | ICD-10-CM | POA: Insufficient documentation

## 2012-11-22 DIAGNOSIS — R011 Cardiac murmur, unspecified: Secondary | ICD-10-CM | POA: Insufficient documentation

## 2012-11-22 DIAGNOSIS — IMO0002 Reserved for concepts with insufficient information to code with codable children: Secondary | ICD-10-CM | POA: Insufficient documentation

## 2012-11-22 DIAGNOSIS — Z9889 Other specified postprocedural states: Secondary | ICD-10-CM | POA: Insufficient documentation

## 2012-11-22 DIAGNOSIS — G4730 Sleep apnea, unspecified: Secondary | ICD-10-CM | POA: Insufficient documentation

## 2012-11-22 DIAGNOSIS — R45851 Suicidal ideations: Secondary | ICD-10-CM | POA: Insufficient documentation

## 2012-11-22 DIAGNOSIS — Z87448 Personal history of other diseases of urinary system: Secondary | ICD-10-CM | POA: Insufficient documentation

## 2012-11-22 DIAGNOSIS — I509 Heart failure, unspecified: Secondary | ICD-10-CM | POA: Insufficient documentation

## 2012-11-22 DIAGNOSIS — J4489 Other specified chronic obstructive pulmonary disease: Secondary | ICD-10-CM | POA: Insufficient documentation

## 2012-11-22 LAB — COMPREHENSIVE METABOLIC PANEL
ALT: 33 U/L (ref 0–53)
AST: 29 U/L (ref 0–37)
CO2: 24 mEq/L (ref 19–32)
Calcium: 9.6 mg/dL (ref 8.4–10.5)
Chloride: 102 mEq/L (ref 96–112)
GFR calc Af Amer: >90 mL/min (ref >90)
GFR calc non Af Amer: >90 mL/min (ref >90)
Glucose, Bld: 106 mg/dL — ABNORMAL HIGH (ref 70–99)
Sodium: 140 mEq/L (ref 135–145)
Total Bilirubin: 0.3 mg/dL (ref 0.3–1.2)

## 2012-11-22 LAB — RAPID URINE DRUG SCREEN, HOSP PERFORMED
Amphetamines: NOT DETECTED
Barbiturates: NOT DETECTED
Tetrahydrocannabinol: NOT DETECTED

## 2012-11-22 LAB — CBC WITH DIFFERENTIAL/PLATELET
Eosinophils Absolute: 0.6 10*3/uL (ref 0.0–0.7)
Hemoglobin: 14.6 g/dL (ref 13.0–17.0)
Lymphocytes Relative: 15 % (ref 12–46)
Lymphs Abs: 1.7 10*3/uL (ref 0.7–4.0)
MCH: 30.5 pg (ref 26.0–34.0)
Monocytes Relative: 5 % (ref 3–12)
Neutro Abs: 8.3 10*3/uL — ABNORMAL HIGH (ref 1.7–7.7)
Neutrophils Relative %: 74 % (ref 43–77)
Platelets: 285 10*3/uL (ref 150–400)
RBC: 4.78 MIL/uL (ref 4.22–5.81)
WBC: 11.1 10*3/uL — ABNORMAL HIGH (ref 4.0–10.5)

## 2012-11-22 MED ORDER — SODIUM CHLORIDE 0.9 % IV BOLUS (SEPSIS)
1000.0000 mL | Freq: Once | INTRAVENOUS | Status: AC
  Administered 2012-11-22: 1000 mL via INTRAVENOUS

## 2012-11-22 MED ORDER — HALOPERIDOL LACTATE 5 MG/ML IJ SOLN
10.0000 mg | Freq: Once | INTRAMUSCULAR | Status: AC
  Administered 2012-11-22: 10 mg via INTRAMUSCULAR
  Filled 2012-11-22: qty 2

## 2012-11-22 NOTE — ED Notes (Signed)
Pt took approx 50 pills of (2 mg Ativan at approx 0815). Witnessed by [place], pt's wife. Pt denies SI. Pt states he has been in pain for 2 days.
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
## 2012-11-22 NOTE — ED Notes (Signed)
Pt screaming, moaning and demanding. Reassured patient. Pt given a basin with ice water. Pt's significant other at bedside.

## 2012-11-22 NOTE — ED Notes (Signed)
Pt confused, yelling out for "Sabrina"  (wife)pulling off clothes, no sheets on the bed, sitting with legs stradling the side rails-- Seizure pads placed on bed for safety--

## 2012-11-22 NOTE — ED Notes (Signed)
Patient soiled self and bed. Patient cleaned and linens changed. Patient tolerated well. Patient resting on stretcher at this time. Patient will call out for [name], his wife. Informed patient that his wife has gone home. Sitter in room with patient. No acute distress noted. resp are even and unlabored. Will continue to monitor.

## 2012-11-22 NOTE — ED Notes (Signed)
Incontinent of stool in bed--cleaned at bedside-- clean gown placed on, continues to pull at gown and clothing.
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
## 2012-11-22 NOTE — ED Notes (Signed)
Pt moved on stretcher from POD B bed 17 to POD C bed 21.

## 2012-11-22 NOTE — ED Notes (Signed)
Pt sitting up on stretcher. Pt in NAD. Significant other at bedside.

## 2012-11-22 NOTE — ED Notes (Signed)
Pt remains uncooperative. Refuses to leave pulse ox on. Pt resting on stretcher. Family member at bedside.

## 2012-11-22 NOTE — ED Provider Notes (Signed)
I saw and evaluated the patient, reviewed the resident's note and I agree with the findings including ECG interpretation and plan.  57 year old male with chronic severe pain, anxiety, depression, diabetes, transverse myelitis, para paresis, chronic baclofen pump, wants to die, discharged yesterday from [facility] with anxiety depression, witnessed overdose at approximately 50 tablets of lorazepam today within the last hour prior to arrival.  IVC completed; Pt wanted to leave ED. 1030  Pt stable in ED, Telepsych and ACT consulted. 1530  Hurman Horn, MD 11/22/12 2227
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
## 2012-11-22 NOTE — ED Notes (Signed)
ACT team at bedside with Involuntary Commitment paperwork.

## 2012-11-22 NOTE — BH Assessment (Signed)
[facility] Assessment Progress Note      This clinician attempted to assess pt.  He is not alert or oriented and is unable to be assessed at this time.  Dr. Berlin Hun with SOC tried to see pt via telepsych and was unable to complete her assessment for the same reasons.  She will send in consult stating she was unable to assess pt and that another consult will have to be submitted once pt able to participate in assessment.

## 2012-11-23 LAB — GLUCOSE, CAPILLARY
Glucose-Capillary: 102 mg/dL — ABNORMAL HIGH (ref 70–99)
Glucose-Capillary: 115 mg/dL — ABNORMAL HIGH (ref 70–99)

## 2012-11-23 MED ORDER — LORAZEPAM 1 MG PO TABS
1.0000 mg | ORAL_TABLET | Freq: Two times a day (BID) | ORAL | Status: DC
  Administered 2012-11-23 – 2012-11-24 (×3): 1 mg via ORAL
  Filled 2012-11-23 (×4): qty 1

## 2012-11-23 MED ORDER — DILTIAZEM HCL ER 180 MG PO CP24
180.0000 mg | ORAL_CAPSULE | Freq: Every day | ORAL | Status: DC
  Administered 2012-11-23 – 2012-11-24 (×2): 180 mg via ORAL
  Filled 2012-11-23 (×2): qty 1

## 2012-11-23 MED ORDER — NICOTINE 21 MG/24HR TD PT24
21.0000 mg | MEDICATED_PATCH | Freq: Once | TRANSDERMAL | Status: AC
  Administered 2012-11-23: 21 mg via TRANSDERMAL

## 2012-11-23 MED ORDER — LIRAGLUTIDE 18 MG/3ML ~~LOC~~ SOPN
1.0000 "application " | PEN_INJECTOR | Freq: Every day | SUBCUTANEOUS | Status: DC
  Administered 2012-11-24: 1 via SUBCUTANEOUS

## 2012-11-23 MED ORDER — HYDROCHLOROTHIAZIDE 12.5 MG PO CAPS
12.5000 mg | ORAL_CAPSULE | Freq: Every day | ORAL | Status: DC
  Administered 2012-11-23 – 2012-11-24 (×2): 12.5 mg via ORAL
  Filled 2012-11-23 (×2): qty 1

## 2012-11-23 MED ORDER — GABAPENTIN 300 MG PO CAPS
300.0000 mg | ORAL_CAPSULE | Freq: Every evening | ORAL | Status: DC | PRN
  Administered 2012-11-23: 600 mg via ORAL
  Filled 2012-11-23: qty 2

## 2012-11-23 MED ORDER — PANTOPRAZOLE SODIUM 40 MG PO TBEC
40.0000 mg | DELAYED_RELEASE_TABLET | Freq: Every day | ORAL | Status: DC
  Administered 2012-11-23 – 2012-11-24 (×2): 40 mg via ORAL
  Filled 2012-11-23 (×2): qty 1

## 2012-11-23 MED ORDER — LISINOPRIL 10 MG PO TABS
10.0000 mg | ORAL_TABLET | Freq: Every day | ORAL | Status: DC
  Administered 2012-11-23 – 2012-11-24 (×2): 10 mg via ORAL
  Filled 2012-11-23 (×2): qty 1

## 2012-11-23 MED ORDER — LOPERAMIDE HCL 2 MG PO CAPS
2.0000 mg | ORAL_CAPSULE | ORAL | Status: DC | PRN
  Administered 2012-11-23: 2 mg via ORAL
  Filled 2012-11-23: qty 1

## 2012-11-23 MED ORDER — LISINOPRIL-HYDROCHLOROTHIAZIDE 10-12.5 MG PO TABS: 1.0000 | ORAL_TABLET | Freq: Every day | ORAL | Status: DC

## 2012-11-23 MED ORDER — OXYCODONE-ACETAMINOPHEN 5-325 MG PO TABS
1.0000 | ORAL_TABLET | ORAL | Status: DC | PRN
  Administered 2012-11-23 – 2012-11-24 (×7): 1 via ORAL
  Filled 2012-11-23 (×7): qty 1

## 2012-11-23 MED ORDER — MOMETASONE FURO-FORMOTEROL FUM 100-5 MCG/ACT IN AERO
2.0000 | INHALATION_SPRAY | Freq: Two times a day (BID) | RESPIRATORY_TRACT | Status: DC
  Administered 2012-11-23 – 2012-11-24 (×3): 2 via RESPIRATORY_TRACT
  Filled 2012-11-23: qty 8.8

## 2012-11-23 NOTE — ED Notes (Signed)
Notified RN of CBG 102 

## 2012-11-23 NOTE — BH Assessment (Signed)
[facility] Assessment Progress Note      Consulted with Dahlia Byes NP re admission for this patient but at this time there are no appropriate beds for him so he will not be considered at this time, continue to look for beds elsewhere.

## 2012-11-23 NOTE — ED Notes (Signed)
Patient soiled self and bed. Cleaned patient. Informed EDP patient continues to have diarrhea.
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
## 2012-11-23 NOTE — ED Notes (Signed)
telepsych consulted to complete consult- now that patient is awake, alert and cooperative

## 2012-11-23 NOTE — ED Notes (Signed)
Pt's wife brought his LIRAGLUTIDE SOPN from home-- given to pharmacy to get locked up and dispensed daily by pharmacy-

## 2012-11-23 NOTE — ED Notes (Signed)
Patient soiled self and bed. Cleaned bed and patient.

## 2012-11-24 ENCOUNTER — Encounter (HOSPITAL_COMMUNITY): Payer: Self-pay | Admitting: *Deleted

## 2012-11-24 MED ORDER — OXYCODONE-ACETAMINOPHEN 5-325 MG PO TABS
1.0000 | ORAL_TABLET | Freq: Once | ORAL | Status: AC
  Administered 2012-11-24: 1 via ORAL
  Filled 2012-11-24: qty 1

## 2012-11-24 MED ORDER — LORAZEPAM 1 MG PO TABS: 1.0000 mg | ORAL_TABLET | Freq: Once | ORAL | Status: DC

## 2012-11-24 MED ORDER — DIAZEPAM 5 MG PO TABS
5.0000 mg | ORAL_TABLET | Freq: Once | ORAL | Status: AC
  Administered 2012-11-24: 5 mg via ORAL
  Filled 2012-11-24: qty 1

## 2012-11-24 NOTE — BH Assessment (Signed)
[facility] Assessment Progress Note       Bed finding: No answer at [facility] available at [facility].  Faxed referral No beds at [facility] per Lee Expecting a discharge at [facility] later today per Jackie, faxed referral for review. Left message at [facility] [place]. 

## 2012-11-24 NOTE — ED Notes (Signed)
Pt sitting on the side of the bed, wife at bedside. Pt currently talking with tele psych at this time.

## 2012-11-24 NOTE — Progress Notes (Signed)
Tele-psych completed IVC reversed by Dr. Jacky Kindle SOS. Denice Bors, AADC 11/24/2012 4:21 PM

## 2012-11-24 NOTE — ED Notes (Signed)
Pt. Asleep. Will give ativan once pt. Is awake.

## 2012-11-24 NOTE — ED Notes (Signed)
Consultation called, and faxed to Specialist on call. Transmission report of fax received.

## 2012-11-24 NOTE — ED Notes (Signed)
Unable to sign E- document, would not allow patient to sign.

## 2012-11-24 NOTE — BHH Counselor (Signed)
Per Tim, Assessment Office, pt is declined due to medical acuity.Denice Bors, AADC 11/24/2012 2:32 PM

## 2012-11-24 NOTE — ED Provider Notes (Addendum)
Patient under IVC for overdose of Ativan stating he wanted to die at the time. He has been in the emergency room for now 36 hours and is currently denying any SI and states that he took the Ativan because he was having muscle spasms and wanted them to stop.  Patient initially saw psychiatry who recommended inpatient psychiatric admission and substance abuse admission. Because the patient is now denying SI and will get a repeat evaluation  4:27 PM Pt with telepsych that recended IVC and feel that pt is safe to go home.  Wife also present and agrees.  Pt denies SI/HI and feel ok for d/c.  Gwyneth Sprout, MD 11/24/12 [id number]  Gwyneth Sprout, MD 11/24/12 1191

## 2012-11-24 NOTE — ED Notes (Signed)
Awaiting for wife to come pick patient up and bring his clothes

## 2012-11-25 LAB — CULTURE, BLOOD (SINGLE)

## 2012-11-27 ENCOUNTER — Encounter: Payer: Self-pay | Admitting: Pediatrics

## 2012-11-27 ENCOUNTER — Ambulatory Visit (INDEPENDENT_AMBULATORY_CARE_PROVIDER_SITE_OTHER): Payer: 59 | Admitting: Pediatrics

## 2012-11-27 ENCOUNTER — Other Ambulatory Visit: Payer: Self-pay

## 2012-11-27 DIAGNOSIS — J449 Chronic obstructive pulmonary disease, unspecified: Secondary | ICD-10-CM

## 2012-11-27 DIAGNOSIS — E1159 Type 2 diabetes mellitus with other circulatory complications: Secondary | ICD-10-CM

## 2012-11-27 DIAGNOSIS — G373 Acute transverse myelitis in demyelinating disease of central nervous system: Secondary | ICD-10-CM

## 2012-11-27 DIAGNOSIS — G2569 Other tics of organic origin: Secondary | ICD-10-CM

## 2012-11-27 DIAGNOSIS — G0489 Other myelitis: Secondary | ICD-10-CM

## 2012-11-27 DIAGNOSIS — S88119A Complete traumatic amputation at level between knee and ankle, unspecified lower leg, initial encounter: Secondary | ICD-10-CM

## 2012-11-27 DIAGNOSIS — IMO0002 Reserved for concepts with insufficient information to code with codable children: Secondary | ICD-10-CM

## 2012-11-27 DIAGNOSIS — G547 Phantom limb syndrome without pain: Secondary | ICD-10-CM

## 2012-11-27 DIAGNOSIS — G546 Phantom limb syndrome with pain: Secondary | ICD-10-CM

## 2012-11-27 DIAGNOSIS — F329 Major depressive disorder, single episode, unspecified: Secondary | ICD-10-CM

## 2012-11-27 DIAGNOSIS — G8389 Other specified paralytic syndromes: Secondary | ICD-10-CM

## 2012-11-27 MED ORDER — GABAPENTIN 300 MG PO CAPS: ORAL_CAPSULE | ORAL | Status: DC

## 2012-11-27 MED ORDER — HALOPERIDOL 1 MG PO TABS: ORAL_TABLET | ORAL | Status: DC
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
## 2012-11-27 NOTE — Telephone Encounter (Signed)
[name] lvm stating that he needed a Rx for Gabapentin for 3 month supply. He would like it mailed to his home so that he may send it to the mail order pharmacy.

## 2012-11-27 NOTE — Patient Instructions (Signed)
Do not refill year lorazepam.  Take haloperidol 4 times daily for your motor tics.Gabapentin will be increased to 300 mg 4 times daily we'll probably go higher.

## 2012-11-29 ENCOUNTER — Ambulatory Visit: Payer: 59 | Attending: Pediatrics | Admitting: Physical Therapy

## 2012-11-29 DIAGNOSIS — R5381 Other malaise: Secondary | ICD-10-CM | POA: Insufficient documentation

## 2012-11-29 DIAGNOSIS — IMO0001 Reserved for inherently not codable concepts without codable children: Secondary | ICD-10-CM | POA: Insufficient documentation

## 2012-12-06 ENCOUNTER — Ambulatory Visit: Payer: Self-pay | Admitting: Internal Medicine

## 2012-12-06 ENCOUNTER — Telehealth: Payer: Self-pay | Admitting: Family

## 2012-12-06 NOTE — Telephone Encounter (Signed)
A caller identifying herself as [name], a Sports coach from [employer], called and said that she had spoken to the patient by phone and that she was very concerned about his recent hospitalizations,the medications he was taking and his mental health. She said that when she had attempted to talk to [name] about her concerns and suggested counseling that he had said that he did not need counseling and abruptly hung up on her. The call was of very poor quality and I had difficulty hearing her. She sounded distraught or as if she were crying, but it may have been the call quality. I could not get her phone number as the sound faded in and out. TG

## 2012-12-17 ENCOUNTER — Telehealth: Payer: Self-pay

## 2012-12-17 NOTE — Telephone Encounter (Signed)
[name] from [facility] lvm stating that they were in the middle of pursuing a power wc for  [name] and needs to get him in for the face to face portion of the procedure.[name] said that his call back number was [phone]. At the end of the message I heard  [name] ask if the man was still on the line and then the message ended.

## 2012-12-17 NOTE — Telephone Encounter (Signed)
I left a message for [name] and asked him to call back. Tammy Sours has an appointment already scheduled next week for pump refill. Perhaps we can fulfill the requirements for face to face meeting to get power chair at that time. TG

## 2012-12-18 NOTE — Telephone Encounter (Signed)
The person from [facility] has not called back. TG

## 2012-12-19 ENCOUNTER — Telehealth: Payer: Self-pay

## 2012-12-19 DIAGNOSIS — G2569 Other tics of organic origin: Secondary | ICD-10-CM

## 2012-12-19 DIAGNOSIS — M4714 Other spondylosis with myelopathy, thoracic region: Secondary | ICD-10-CM

## 2012-12-19 DIAGNOSIS — M79609 Pain in unspecified limb: Secondary | ICD-10-CM

## 2012-12-19 DIAGNOSIS — IMO0002 Reserved for concepts with insufficient information to code with codable children: Secondary | ICD-10-CM

## 2012-12-19 DIAGNOSIS — G4700 Insomnia, unspecified: Secondary | ICD-10-CM

## 2012-12-19 DIAGNOSIS — G839 Paralytic syndrome, unspecified: Secondary | ICD-10-CM

## 2012-12-19 MED ORDER — ZOLPIDEM TARTRATE 10 MG PO TABS: ORAL_TABLET | ORAL | Status: DC

## 2012-12-19 MED ORDER — HALOPERIDOL 1 MG PO TABS: ORAL_TABLET | ORAL | Status: DC

## 2012-12-19 MED ORDER — OXYCODONE-ACETAMINOPHEN 10-325 MG PO TABS: ORAL_TABLET | ORAL | Status: DC

## 2012-12-19 NOTE — Telephone Encounter (Signed)
I called and let  Sours know that they were ready.

## 2012-12-19 NOTE — Telephone Encounter (Signed)
Greg lvm asking for refills. He asked that we call him when the Rx's are ready for pick up [phone].

## 2012-12-19 NOTE — Telephone Encounter (Signed)
Tammy, the prescriptions are ready. Please let Tammy Sours know. Thanks [employer]

## 2012-12-27 ENCOUNTER — Ambulatory Visit (INDEPENDENT_AMBULATORY_CARE_PROVIDER_SITE_OTHER): Payer: 59 | Admitting: Pediatrics

## 2012-12-27 ENCOUNTER — Encounter: Payer: Self-pay | Admitting: Pediatrics

## 2012-12-27 VITALS — BP 106/66 | HR 84

## 2012-12-27 DIAGNOSIS — G373 Acute transverse myelitis in demyelinating disease of central nervous system: Secondary | ICD-10-CM

## 2012-12-27 DIAGNOSIS — J4489 Other specified chronic obstructive pulmonary disease: Secondary | ICD-10-CM

## 2012-12-27 DIAGNOSIS — F329 Major depressive disorder, single episode, unspecified: Secondary | ICD-10-CM

## 2012-12-27 DIAGNOSIS — G0489 Other myelitis: Secondary | ICD-10-CM

## 2012-12-27 DIAGNOSIS — E1159 Type 2 diabetes mellitus with other circulatory complications: Secondary | ICD-10-CM

## 2012-12-27 DIAGNOSIS — IMO0002 Reserved for concepts with insufficient information to code with codable children: Secondary | ICD-10-CM

## 2012-12-27 DIAGNOSIS — G4700 Insomnia, unspecified: Secondary | ICD-10-CM

## 2012-12-27 DIAGNOSIS — G2569 Other tics of organic origin: Secondary | ICD-10-CM

## 2012-12-27 DIAGNOSIS — G8389 Other specified paralytic syndromes: Secondary | ICD-10-CM

## 2012-12-27 DIAGNOSIS — M79609 Pain in unspecified limb: Secondary | ICD-10-CM

## 2012-12-27 DIAGNOSIS — J449 Chronic obstructive pulmonary disease, unspecified: Secondary | ICD-10-CM

## 2012-12-27 MED ORDER — HALOPERIDOL 1 MG PO TABS: ORAL_TABLET | ORAL | Status: DC

## 2012-12-27 NOTE — Patient Instructions (Addendum)
Let me know if you get to sleep the on the increased dose of Haldol.  Try to taper your oxycodone if you can.

## 2012-12-28 NOTE — Progress Notes (Signed)
Patient: [name] MRN: [id number] Sex: male DOB: 07/14/1955  Provider: Deetta Perla, MD Location of Care: [facility]  Note type: Routine return visit  History of Present Illness: Referral Source: Beverely Risen, M.D. History from: spouse, patient and [facility] chart Chief Complaint: Transverse myelitis, chronic pain syndrome, motor tic disorder  [name] is a 57 y.o. male who returns for evaluation and management of transverse myelitis, chronic pain syndrome, and motor tic disorder.  Face-to-face evaluation for wheelchair acquisition, emptying refilling and reprogramming intrathecal Baclofen pump.  The patient returns on December 27, 2012, for emptying, refilling, and reprogramming his intrathecal baclofen pump.  I saw him for more detailed assessment because of the need for face-to-face assessment, so that he can qualify for a new motorized wheelchair.    He has transverse myelitis of unknown etiology.  He had right greater than left weakness and spasticity and had evidence of a thoracic sensory level.  He has problems with urinary incontinence.  He has had spasm-like movements that he calls tics.  I witnessed these and they are tic-like in nature.  It is clear that they are painful.  However, it is not clear to me whether the pain causes his movement or his movement causes pain.    He is morbidly obese and developed type 2 diabetes mellitus, dyslipidemia, and vascular insufficiency that led to an above the knee amputation of the left leg.  He had significant phantom pain in his limb.  He is a heavy smoker.  He had been drinking heavily and appeared one day in my office having taken a large amount of Ativan clearly impaired.  He was hospitalized for what appeared to be a suicidal gesture in June 2014.    I saw him after his hospitalization.  I told them that I would no longer provide lorazepam to him.  Instead we increased his gabapentin and placed him on Haldol.  His  phantom limb pain is nearly gone.  He is sleeping better.  He continues to have significant problems with tics.  He has not developed any significant somnolence tardive dyskinesia with the dose of Haldol that he currently receives.  He says that the oxycodone that he takes for pain does not help him.  I challenged him to continue to taper.  He had been on four tablets a day and is now down to three.  I recommended that he try two and see if that works.    He has a very old motorized wheelchair.  He has seen a physical therapist and has been fit for a new one.  This wheelchair is not only motorized, but will allow him to recline, which will take some of the pressure off his back.  He has been taking care of his hygiene and has a bench for his bathroom.  I think that he may have some small pressure ulcers in points of prolonged contact with his sacral region.  I did not take off his clothes to look at that.  Procedure interrogation, emptying, refilling and reprogramming of intrathecal baclofen pump.   His new pump was placed September 05, 2011. Serial number [id number], the model [id number]   Concentration of baclofen is 2000 mcg/mL. Basal rate 30.0 Mcg per hour. He has 2 - 60 mcg bolus doses at 5 AM, 11 AM; 2 - 80 mcg at 5 PM, and 11 PM. Total daily dose 969.5 mcg.   I entered the pump on the 1st pass. The  reservoir was entered atraumatically. 7.2 mL was withdrawn placing the pump under partial vacuum.   Through a Millipore filter 40 mL of baclofen, concentration 2000 mcg/mL was instilled. The pump was reprogrammed to reflect the new volume and unchanged dosing rate. Refill interval is 78 days, battery life is 65 months. He tolerated the procedure well. He will return on or before March 15, 2013.  12 system review is negative except as noted above and below.  Past Medical History  Diagnosis Date  . CHF (congestive heart failure)   . COPD (chronic obstructive pulmonary disease)   . Thoracic  myelopathy   . Incontinence   . Insomnia   . Morbid obesity   . Spastic paraparesis   . Hypertension   . Heart murmur     was told yrs ago  . Shortness of breath     all the time  . Sleep apnea     set  at  9  . GERD (gastroesophageal reflux disease)   . Diabetes mellitus without complication   . Movement disorder   . Vision abnormalities    Hospitalizations: yes, Head Injury: no, Nervous System Infections: no, Immunizations up to date: yes  Behavior History The patient appears less depressed and more in control them I seen him in some time.  Surgical History Past Surgical History  Procedure Laterality Date  . Baclofen infusion pump    . Eye surgery    . Cardiac catheterization    . Pain pump implantation  09/05/2011    Procedure: PAIN PUMP INSERTION;  Surgeon: Cristi Loron, MD;  Location: [facility];  Service: Neurosurgery;  Laterality: N/A;  Baclofen Pump Replacement  . Intrathecal baclofen pump    . 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 Family History is negative migraines, seizures, cognitive impairment, blindness, deafness, birth defects, chromosomal disorder, autism.  Social History History   Social History  . Marital Status: Married    Spouse Name: N/A    Number of Children: N/A  . Years of Education: N/A   Social History Main Topics  . Smoking status: Current Every Day Smoker -- 1.00 packs/day for 38 years    Types: Cigarettes  . Smokeless tobacco: None     Comment: Patient smokes 1 pack of cigarettes daily.  . Alcohol Use: 28.0 oz/week    56 drink(s) per week  . Drug Use: No  . Sexually Active: None   Other Topics Concern  . None   Social History Narrative   Lives with wife.  No children.    Current Outpatient Prescriptions on File Prior to Visit  Medication Sig Dispense Refill  . diltiazem (DILACOR XR) 180 MG  24 hr capsule Take 180 mg by mouth daily.      . Fluticasone-Salmeterol (ADVAIR) 250-50 MCG/DOSE AEPB Inhale 1 puff into the lungs 2 (two) times daily.      Marland Kitchen gabapentin (NEURONTIN) 300 MG capsule One capsule 4 times daily  372 capsule  0  . IPRATROPIUM-ALBUTEROL IN Inhale 1 vial into the lungs 2 (two) times daily.      . Liraglutide (VICTOZA) 18 MG/3ML SOPN Inject 1 application into the skin daily.      Marland Kitchen lisinopril-hydrochlorothiazide (PRINZIDE,ZESTORETIC) 10-12.5 MG per tablet Take 1 tablet by mouth daily.      Marland Kitchen oxyCODONE-acetaminophen (PERCOCET) 10-325 MG per tablet Take 1 tablet  4 times per day as needed for pain. Dx 724.4  124 tablet  0  . RABEprazole (ACIPHEX) 20 MG tablet Take 20 mg by mouth daily.      Marland Kitchen zolpidem (AMBIEN) 10 MG tablet Take 1 tablet at bedtime for insomnia  30 tablet  0   No current facility-administered medications on file prior to visit.   The medication list was reviewed and reconciled. All changes or newly prescribed medications were explained.  A complete medication list was provided to the patient/caregiver.  Allergies  Allergen Reactions  . Diamox (Acetazolamide) Rash   Physical Exam BP 106/66  Pulse 84  General: morbidly obese gentleman well tanned in no distress  Ears, Nose and Throat: no signs of infection  Neck: supple neck without bruits  Respiratory: clear to auscultation, distant heart sounds  Cardiovascular: no murmurs  Musculoskeletal: He has a left above-the-knee amputation.  Trunk: Flaccid with diminished tone in the right leg; decreased tone and strength in the left leg   Neurologic Exam  Mental Status: oriented, normal language, normal affect  Cranial Nerves: patient has an irregular pupil without reaction and is blind in his left eye, right pupil reacts, visual field in the right eye is full to double simultaneous stimuli, symmetric facial strength , midline tongue and uvula,  Motor: The patient has diminished tone in his right leg. He  can move the left leg stump, but not the right leg independently. He has normal tone and strength in his arms, and good fine motor movements. Sensory: diminished globally, Peripheral polyneuropathy, good stereoagnosis  Coordination: unable to test in the lower extremities, normal in the upper extremities  Gait and Station: He is wheelchair-bound  Reflexes: absent in lower extremities because of co-contraction, normal in the upper extremities  Assessment 1. Idiopathic transverse myelitis, 333.82. 2. 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 3. 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 4. 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 5. Morbid obesity, 278.01. 6. Chronic obstructive pulmonary disease, 496. 7. Type 2 diabetes mellitus, uncontrolled, 250.72. 8. Chronic major depression, 296.20.  Plan He seems to be mentally in much better spirits now.  I increased his Haldol to one and a half tablets four times daily (a total of 6 mg per day).  He seems to be in better emotional control and has less episodes of anger.  I think he may also have less tics.  I will not change gabapentin at this time and as mentioned above I recommended that he try to decrease his oxycodone to two tablets per day.  I re-interrogated his pump, which seems to be working well.  This will be dictated separately.  I made no changes.  I had no complications in performing the emptying, refilling, and reprogramming procedure.    He will return on or about March 15, 2013, for emptying, refilling, and reprogramming his intrathecal baclofen pump.  Meds ordered this encounter  Medications  . diltiazem (CARDIZEM CD) 180 MG 24 hr capsule    Sig:   . haloperidol (HALDOL) 1 MG tablet    Sig: 1-1/2 by mouth 4 times a day    Dispense:  186 tablet    Refill:  5   Deetta Perla MD

## 2013-01-01 ENCOUNTER — Telehealth: Payer: Self-pay

## 2013-01-01 NOTE — Telephone Encounter (Signed)
[name] lvm stating that ever since Dr. Rexene Edison increased his haloperidol, he has been drooling excessively. He said that he is decreasing it to 1 po qid.

## 2013-01-01 NOTE — Telephone Encounter (Signed)
This will improve.  Have him call us each day.  We might increase the Haldol if his symptoms go away, but this is an unacceptable side effect.

## 2013-01-01 NOTE — Telephone Encounter (Signed)
I called and spoke to [employer]. He said that Dr Sharene Skeans increased Haldol dose to 1+1/2 tablets 4 times per day at last visit on 12/27/12. He said that he began drooling excessively after he increased the dose. Today he has reduced dose back to 1 tablet 4 times per day. He has not had improvement yet. I told Tammy Sours that I would talk with Dr Sharene Skeans and call him back with instructions. TG

## 2013-01-14 ENCOUNTER — Telehealth: Payer: Self-pay | Admitting: *Deleted

## 2013-01-14 NOTE — Telephone Encounter (Signed)
Tammy Sours called and stated that his tics are really bad and he needs something to help him, he says that the Haldol 1 mg 1-1/2 po QID was working but he had to cut the 1/2 and do 1 pill QID because he was slobbering at the mouth a lot doing the 1-1/2 pills. Tammy Sours says that what they had given him while he was in the hospital really helped his tics however he doesn't remember what the medication given to him was. Tammy Sours would like a call back at [phone].       Thanks,  [name].

## 2013-01-14 NOTE — Telephone Encounter (Signed)
I'm unable to prescribe any other medication that will be useful for his tics.  I reviewed the medical record I cannot find any other medication that he was not already on that was given to him.  He is unable to tolerate doses of Haldol that suppressed tics.  I told her that he might consider tapering medication and stopping it is not helping.

## 2013-01-15 ENCOUNTER — Emergency Department: Payer: Self-pay | Admitting: Emergency Medicine

## 2013-01-15 LAB — COMPREHENSIVE METABOLIC PANEL
Albumin: 3.6 g/dL (ref 3.4–5.0)
Alkaline Phosphatase: 180 U/L — ABNORMAL HIGH (ref 50–136)
Anion Gap: 6 — ABNORMAL LOW (ref 7–16)
Bilirubin,Total: 0.4 mg/dL (ref 0.2–1.0)
Calcium, Total: 9.4 mg/dL (ref 8.5–10.1)
Chloride: 108 mmol/L — ABNORMAL HIGH (ref 98–107)
Creatinine: 0.72 mg/dL (ref 0.60–1.30)
EGFR (African American): >60
Glucose: 88 mg/dL (ref 65–99)
Osmolality: 280 (ref 275–301)
Potassium: 3.4 mmol/L — ABNORMAL LOW (ref 3.5–5.1)
SGOT(AST): 20 U/L (ref 15–37)
SGPT (ALT): 26 U/L (ref 12–78)
Sodium: 141 mmol/L (ref 136–145)

## 2013-01-15 LAB — CBC
HGB: 15.9 g/dL (ref 13.0–18.0)
MCH: 29.9 pg (ref 26.0–34.0)
MCHC: 33.9 g/dL (ref 32.0–36.0)
MCV: 88 fL (ref 80–100)
RDW: 15.5 % — ABNORMAL HIGH (ref 11.5–14.5)
WBC: 10.7 10*3/uL — ABNORMAL HIGH (ref 3.8–10.6)

## 2013-01-15 LAB — URINALYSIS, COMPLETE
Glucose,UR: NEGATIVE mg/dL (ref 0–75)
Ketone: NEGATIVE
Nitrite: POSITIVE
Ph: 6 (ref 4.5–8.0)
Protein: 30
RBC,UR: 9 /HPF (ref 0–5)
Specific Gravity: 1.008 (ref 1.003–1.030)
Squamous Epithelial: 2
WBC UR: 284 /HPF (ref 0–5)

## 2013-01-16 ENCOUNTER — Telehealth: Payer: Self-pay | Admitting: Family

## 2013-01-16 NOTE — Telephone Encounter (Signed)
Greg left a message saying that he was out of Gabapentin and need a refill called in to [facility] in [place]. I have been unable to reach him by phone to talk with him about his request, as the office records indicates that the medication was refilled earlier this month. I will attempt to call the pharmacy and continue to call the patient.TG

## 2013-01-16 NOTE — Telephone Encounter (Signed)
I called the pharmacy and there was apparently more than one prescription for Gabapentin on file. I clarified the directions and they said that he had refills available. I called Tammy Sours and let him know. TG

## 2013-01-18 ENCOUNTER — Telehealth: Payer: Self-pay

## 2013-01-18 DIAGNOSIS — G839 Paralytic syndrome, unspecified: Secondary | ICD-10-CM

## 2013-01-18 DIAGNOSIS — M79609 Pain in unspecified limb: Secondary | ICD-10-CM

## 2013-01-18 DIAGNOSIS — G2569 Other tics of organic origin: Secondary | ICD-10-CM

## 2013-01-18 DIAGNOSIS — IMO0002 Reserved for concepts with insufficient information to code with codable children: Secondary | ICD-10-CM

## 2013-01-18 DIAGNOSIS — M4714 Other spondylosis with myelopathy, thoracic region: Secondary | ICD-10-CM

## 2013-01-18 DIAGNOSIS — G4700 Insomnia, unspecified: Secondary | ICD-10-CM

## 2013-01-18 MED ORDER — OXYCODONE-ACETAMINOPHEN 10-325 MG PO TABS: ORAL_TABLET | ORAL | Status: DC

## 2013-01-18 MED ORDER — HALOPERIDOL 1 MG PO TABS: ORAL_TABLET | ORAL | Status: DC

## 2013-01-18 MED ORDER — ZOLPIDEM TARTRATE 10 MG PO TABS: ORAL_TABLET | ORAL | Status: DC

## 2013-01-18 NOTE — Telephone Encounter (Signed)
Please let Tammy Sours know that his Rx's are ready to be picked up. Thanks, Inetta Fermo

## 2013-01-18 NOTE — Telephone Encounter (Signed)
Rx approved. TG 

## 2013-01-18 NOTE — Telephone Encounter (Signed)
Spoke w  Sours and informed him the Rxs were ready for p/u.

## 2013-01-18 NOTE — Telephone Encounter (Signed)
[name] lvm stating that he needed refills on his medication. Also said that [facility] we [facility] earlier today. He was told he would have to use the mail order pharmacy and will need a Rx for a 90 day supply. Please call when ready for pick up [phone].

## 2013-02-08 ENCOUNTER — Encounter: Payer: Self-pay | Admitting: Family

## 2013-02-08 ENCOUNTER — Other Ambulatory Visit: Payer: Self-pay | Admitting: Family

## 2013-02-08 DIAGNOSIS — J449 Chronic obstructive pulmonary disease, unspecified: Secondary | ICD-10-CM

## 2013-02-08 DIAGNOSIS — G0489 Other myelitis: Secondary | ICD-10-CM

## 2013-02-08 DIAGNOSIS — IMO0002 Reserved for concepts with insufficient information to code with codable children: Secondary | ICD-10-CM

## 2013-02-08 DIAGNOSIS — S98919A Complete traumatic amputation of unspecified foot, level unspecified, initial encounter: Secondary | ICD-10-CM

## 2013-02-08 DIAGNOSIS — M4714 Other spondylosis with myelopathy, thoracic region: Secondary | ICD-10-CM

## 2013-02-08 DIAGNOSIS — G8389 Other specified paralytic syndromes: Secondary | ICD-10-CM

## 2013-02-08 DIAGNOSIS — E1159 Type 2 diabetes mellitus with other circulatory complications: Secondary | ICD-10-CM

## 2013-02-18 ENCOUNTER — Telehealth: Payer: Self-pay

## 2013-02-18 DIAGNOSIS — M4714 Other spondylosis with myelopathy, thoracic region: Secondary | ICD-10-CM

## 2013-02-18 DIAGNOSIS — G4700 Insomnia, unspecified: Secondary | ICD-10-CM

## 2013-02-18 DIAGNOSIS — IMO0002 Reserved for concepts with insufficient information to code with codable children: Secondary | ICD-10-CM

## 2013-02-18 DIAGNOSIS — G2569 Other tics of organic origin: Secondary | ICD-10-CM

## 2013-02-18 DIAGNOSIS — M79609 Pain in unspecified limb: Secondary | ICD-10-CM

## 2013-02-18 DIAGNOSIS — G839 Paralytic syndrome, unspecified: Secondary | ICD-10-CM

## 2013-02-18 MED ORDER — OXYCODONE-ACETAMINOPHEN 10-325 MG PO TABS: ORAL_TABLET | ORAL | Status: DC

## 2013-02-18 MED ORDER — ZOLPIDEM TARTRATE 10 MG PO TABS: ORAL_TABLET | ORAL | Status: DC

## 2013-02-18 MED ORDER — HALOPERIDOL 1 MG PO TABS: ORAL_TABLET | ORAL | Status: DC

## 2013-02-18 NOTE — Telephone Encounter (Signed)
[name] lvm stating that he needed refills. Please call when ready for p/u at [phone].

## 2013-03-15 ENCOUNTER — Ambulatory Visit (INDEPENDENT_AMBULATORY_CARE_PROVIDER_SITE_OTHER): Payer: 59 | Admitting: Pediatrics

## 2013-03-15 DIAGNOSIS — G547 Phantom limb syndrome without pain: Secondary | ICD-10-CM

## 2013-03-15 DIAGNOSIS — R209 Unspecified disturbances of skin sensation: Secondary | ICD-10-CM

## 2013-03-15 DIAGNOSIS — J449 Chronic obstructive pulmonary disease, unspecified: Secondary | ICD-10-CM

## 2013-03-15 DIAGNOSIS — M62838 Other muscle spasm: Secondary | ICD-10-CM

## 2013-03-15 DIAGNOSIS — M4714 Other spondylosis with myelopathy, thoracic region: Secondary | ICD-10-CM

## 2013-03-15 DIAGNOSIS — G2569 Other tics of organic origin: Secondary | ICD-10-CM

## 2013-03-15 DIAGNOSIS — G4700 Insomnia, unspecified: Secondary | ICD-10-CM

## 2013-03-15 DIAGNOSIS — G546 Phantom limb syndrome with pain: Secondary | ICD-10-CM

## 2013-03-15 DIAGNOSIS — R200 Anesthesia of skin: Secondary | ICD-10-CM

## 2013-03-15 NOTE — Progress Notes (Signed)
Patient: [name] MRN: [id number] Sex: male DOB: 11-29-1955  Provider: Deetta Perla, MD Location of Care: [facility]  Note type: Routine return visit  History of Present Illness: Referral Source: Dr. 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 male who returns to empty refill and reprogram intrathecal baclofen pump  Procedure interrogation, emptying, refilling and reprogramming of intrathecal baclofen pump.   His new pump was placed September 05, 2011. Serial number [id number], model [id number]   Concentration of baclofen is 2000 mcg/mL.  Basal rate 30.0 Mcg per hour.  He has 2 - 60 mcg bolus doses at 5 AM, 11 AM; 2 - 80 mcg at 5 PM, and 11 PM.  Total daily dose 969.5 mcg.  I entered the pump on the 4th pass. The reservoir was entered atraumatically. 7.0 mL was withdrawn placing the pump under partial vacuum.  Through a Millipore filter 40 mL of baclofen, concentration 2000 mcg/mL was instilled.  The pump was reprogrammed to reflect the new volume and unchanged dosing rate.  Refill interval is 78 days, battery life is 62months.  He tolerated the procedure well.  He will return on or before June 01, 2013.  Review of Systems: 12 system review was unremarkable 4 new issues.  The patient has lost about 20 pounds he was last seen.  Past Medical History  Diagnosis Date  . CHF (congestive heart failure)   . COPD (chronic obstructive pulmonary disease)   . Thoracic myelopathy   . Incontinence   . Insomnia   . Morbid obesity   . Spastic paraparesis   . Hypertension   . Heart murmur     was told yrs ago  . Shortness of breath     all the time  . Sleep apnea     set  at  9  . GERD (gastroesophageal reflux disease)   . Diabetes mellitus without complication   . Movement disorder   . Vision abnormalities    Hospitalizations: yes, Head Injury: no,  Nervous System Infections: no, Immunizations up to date: yes Past Medical History Comments: none.  Behavior History depression  Surgical History Past Surgical History  Procedure Laterality Date  . Baclofen infusion pump    . Eye surgery    . Cardiac catheterization    . Pain pump implantation  09/05/2011    Procedure: PAIN PUMP INSERTION;  Surgeon: Cristi Loron, MD;  Location: [facility];  Service: Neurosurgery;  Laterality: N/A;  Baclofen Pump Replacement  . Intrathecal baclofen pump    . 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 Family History is negative migraines, seizures, cognitive impairment, blindness, deafness, birth defects, chromosomal disorder, autism.  Social History History   Social History  . Marital Status: Married    Spouse Name: N/A    Number of Children: N/A  . Years of Education: N/A   Social History Main Topics  . Smoking status: Current Every Day Smoker -- 1.00 packs/day for 38 years    Types: Cigarettes  . Smokeless tobacco: Not on file     Comment: Patient smokes 1 pack of cigarettes daily.  . Alcohol Use: 28.0 oz/week    56 drink(s) per week  . Drug Use: No  . Sexual Activity: Not on file  Other Topics Concern  . Not on file   Social History Narrative   Lives with wife.  No children.   Living with spouse   Current Outpatient Prescriptions on File Prior to Visit  Medication Sig Dispense Refill  . diltiazem (CARDIZEM CD) 180 MG 24 hr capsule       . diltiazem (DILACOR XR) 180 MG 24 hr capsule Take 180 mg by mouth daily.      . Fluticasone-Salmeterol (ADVAIR) 250-50 MCG/DOSE AEPB Inhale 1 puff into the lungs 2 (two) times daily.      Marland Kitchen gabapentin (NEURONTIN) 300 MG capsule One capsule 4 times daily  372 capsule  0  . haloperidol (HALDOL) 1 MG tablet 1-1/2 by mouth 4 times a day  180 tablet  0  .  IPRATROPIUM-ALBUTEROL IN Inhale 1 vial into the lungs 2 (two) times daily.      . Liraglutide (VICTOZA) 18 MG/3ML SOPN Inject 1 application into the skin daily.      Marland Kitchen lisinopril-hydrochlorothiazide (PRINZIDE,ZESTORETIC) 10-12.5 MG per tablet Take 1 tablet by mouth daily.      Marland Kitchen oxyCODONE-acetaminophen (PERCOCET) 10-325 MG per tablet Take 1 tablet 4 times per day as needed for pain. Dx 724.4  124 tablet  0  . RABEprazole (ACIPHEX) 20 MG tablet Take 20 mg by mouth daily.      Marland Kitchen zolpidem (AMBIEN) 10 MG tablet Take 1 tablet at bedtime for insomnia  30 tablet  0   No current facility-administered medications on file prior to visit.   The medication list was reviewed and reconciled. All changes or newly prescribed medications were explained.  A complete medication list was provided to the patient/caregiver.  Allergies  Allergen Reactions  . 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  Plan Empty, refill, and reprogram intrathecal baclofen pump  Deetta Perla MD

## 2013-03-21 ENCOUNTER — Telehealth: Payer: Self-pay

## 2013-03-21 DIAGNOSIS — IMO0002 Reserved for concepts with insufficient information to code with codable children: Secondary | ICD-10-CM

## 2013-03-21 DIAGNOSIS — G4700 Insomnia, unspecified: Secondary | ICD-10-CM

## 2013-03-21 DIAGNOSIS — G839 Paralytic syndrome, unspecified: Secondary | ICD-10-CM

## 2013-03-21 DIAGNOSIS — M4714 Other spondylosis with myelopathy, thoracic region: Secondary | ICD-10-CM

## 2013-03-21 DIAGNOSIS — G2569 Other tics of organic origin: Secondary | ICD-10-CM

## 2013-03-21 DIAGNOSIS — M79609 Pain in unspecified limb: Secondary | ICD-10-CM

## 2013-03-21 MED ORDER — HALOPERIDOL 1 MG PO TABS: ORAL_TABLET | ORAL | Status: DC

## 2013-03-21 MED ORDER — OXYCODONE-ACETAMINOPHEN 10-325 MG PO TABS: ORAL_TABLET | ORAL | Status: DC

## 2013-03-21 MED ORDER — ZOLPIDEM TARTRATE 10 MG PO TABS: ORAL_TABLET | ORAL | Status: DC

## 2013-03-21 NOTE — Telephone Encounter (Signed)
Greg lvm stating that he needed refills. I will call  Sours when Rx's are ready for pick up [phone].

## 2013-03-21 NOTE — Telephone Encounter (Signed)
Prescriptions have been signed electronically.

## 2013-03-21 NOTE — Telephone Encounter (Signed)
Dr. Sharene Skeans, These cannot be sent electronically. Patient must come to the office to pick up. Thanks, [employer]

## 2013-03-21 NOTE — Telephone Encounter (Signed)
Called  [name] and let him know that the Rx's were ready for pick up.

## 2013-04-05 ENCOUNTER — Telehealth: Payer: Self-pay | Admitting: *Deleted

## 2013-04-05 DIAGNOSIS — G2569 Other tics of organic origin: Secondary | ICD-10-CM

## 2013-04-05 NOTE — Telephone Encounter (Signed)
[name] called in and stated that he wants to come off of the Rabeprazole 20 mg daily, he says it's not working for him at all and that he would like a script for Lorazepam called in, I informed the patient that Dr. Sharene Skeans is out of the office until Monday and that this may be an issue that has to be addressed by Dr. Sharene Skeans, he said he understood however he would like for [employer]. To take a look and see if a script for a few Lorazepam's can be called in to get him through the weekend until Dr. Sharene Skeans returns. [name] can be reached at [phone].      Thanks,  [name].

## 2013-04-05 NOTE — Telephone Encounter (Signed)
I called Tammy Sours and talked with him. The medication he was referring to was Haldol, not the Rabeprazole. He said that he was having days of the tics being intolerable and when that happened, the Haldol didn't seem to work at all. He said that he was sleeping only 30-40 minutes at night, waking up for awhile, returning to sleep to sleep 30-40 minutes then waking again. He asked if he could start Lorazepam again to take only at night, because he felt the poor sleep at night was worsening his tics. I told him that we were concerned about him taking Lorazepam because of potential for it to be habit forming. Tammy Sours said that he felt that Dr Sharene Skeans would be holding his suicide attempt against him for the rest of his life. I explained to Tammy Sours that Lorazepam was habit forming for anyone that takes it and that it was appropriate to be cautious with the medication. I told him that I would not prescribe it but that I would talk with Dr Sharene Skeans about his concerns and call him back on Monday, as Dr Sharene Skeans is out of the office today. In the interim, I recommended that he increase Haldol to 2 tablets if he tics worsen again on the weekend. Tammy Sours agreed to that plan. TG

## 2013-04-08 MED ORDER — LORAZEPAM 2 MG PO TABS: ORAL_TABLET | ORAL | Status: DC

## 2013-04-08 NOTE — Telephone Encounter (Signed)
I spoke with the patient and told him about our concerns as regards his attempted suicide previously.  I think that his in a different place mentally and emotionally.  He is not drinking alcohol he seems very clear when I speak to him.  I think it would be good for him to get a nights sleep.:  About the problems with tolerance with this medication and told him that we would not be increasing the dose.  Further, told him That after his suicide attempt, I was the only one who could prescribe this and I would not want Inetta Fermo to do that.

## 2013-04-22 ENCOUNTER — Telehealth: Payer: Self-pay

## 2013-04-22 DIAGNOSIS — M4714 Other spondylosis with myelopathy, thoracic region: Secondary | ICD-10-CM

## 2013-04-22 DIAGNOSIS — G546 Phantom limb syndrome with pain: Secondary | ICD-10-CM

## 2013-04-22 DIAGNOSIS — M79609 Pain in unspecified limb: Secondary | ICD-10-CM

## 2013-04-22 DIAGNOSIS — IMO0002 Reserved for concepts with insufficient information to code with codable children: Secondary | ICD-10-CM

## 2013-04-22 DIAGNOSIS — G4700 Insomnia, unspecified: Secondary | ICD-10-CM

## 2013-04-22 DIAGNOSIS — G839 Paralytic syndrome, unspecified: Secondary | ICD-10-CM

## 2013-04-22 DIAGNOSIS — G2569 Other tics of organic origin: Secondary | ICD-10-CM

## 2013-04-22 MED ORDER — OXYCODONE-ACETAMINOPHEN 10-325 MG PO TABS: ORAL_TABLET | ORAL | Status: DC

## 2013-04-22 MED ORDER — GABAPENTIN 300 MG PO CAPS: ORAL_CAPSULE | ORAL | Status: DC

## 2013-04-22 MED ORDER — ZOLPIDEM TARTRATE 10 MG PO TABS: ORAL_TABLET | ORAL | Status: DC

## 2013-04-22 MED ORDER — HALOPERIDOL 1 MG PO TABS: ORAL_TABLET | ORAL | Status: DC

## 2013-04-22 NOTE — Telephone Encounter (Signed)
Called  Sours to let him know the Rxs were ready for pick up.

## 2013-04-22 NOTE — Telephone Encounter (Signed)
 [name] called needing refills. He asked that the Gabapentin be on a separate Rx bc he will need to send it to his mail order pharmacy. Please call  [name] when ready for pick up [phone].

## 2013-04-26 ENCOUNTER — Other Ambulatory Visit: Payer: Self-pay | Admitting: Pediatrics

## 2013-05-04 ENCOUNTER — Other Ambulatory Visit: Payer: Self-pay | Admitting: Family

## 2013-05-10 ENCOUNTER — Telehealth: Payer: Self-pay

## 2013-05-10 DIAGNOSIS — G2569 Other tics of organic origin: Secondary | ICD-10-CM

## 2013-05-10 MED ORDER — LORAZEPAM 2 MG PO TABS: ORAL_TABLET | ORAL | Status: DC

## 2013-05-10 NOTE — Telephone Encounter (Signed)
Greg lvm asking to pick up Rx for Lorazepam. I will call when this is ready at [phone].

## 2013-05-10 NOTE — Telephone Encounter (Signed)
Called  Sours and let him know the Rx was ready for p/u.

## 2013-05-19 ENCOUNTER — Other Ambulatory Visit: Payer: Self-pay | Admitting: Family

## 2013-05-21 ENCOUNTER — Telehealth: Payer: Self-pay

## 2013-05-21 DIAGNOSIS — G4700 Insomnia, unspecified: Secondary | ICD-10-CM

## 2013-05-21 DIAGNOSIS — G839 Paralytic syndrome, unspecified: Secondary | ICD-10-CM

## 2013-05-21 DIAGNOSIS — M79609 Pain in unspecified limb: Secondary | ICD-10-CM

## 2013-05-21 DIAGNOSIS — IMO0002 Reserved for concepts with insufficient information to code with codable children: Secondary | ICD-10-CM

## 2013-05-21 DIAGNOSIS — M4714 Other spondylosis with myelopathy, thoracic region: Secondary | ICD-10-CM

## 2013-05-21 MED ORDER — ZOLPIDEM TARTRATE 10 MG PO TABS: ORAL_TABLET | ORAL | Status: DC

## 2013-05-21 MED ORDER — OXYCODONE-ACETAMINOPHEN 10-325 MG PO TABS: ORAL_TABLET | ORAL | Status: DC

## 2013-05-21 NOTE — Telephone Encounter (Signed)
Rx's ready to mail. TG

## 2013-05-21 NOTE — Telephone Encounter (Signed)
 [name] called requesting Rx's for Zolpidem and Oxycodone. He asked that we mail them to his home. I have the addressed envelope in my office.

## 2013-05-27 DIAGNOSIS — J189 Pneumonia, unspecified organism: Secondary | ICD-10-CM

## 2013-05-27 HISTORY — DX: Pneumonia, unspecified organism: J18.9

## 2013-05-31 ENCOUNTER — Ambulatory Visit (INDEPENDENT_AMBULATORY_CARE_PROVIDER_SITE_OTHER): Payer: 59 | Admitting: Pediatrics

## 2013-05-31 DIAGNOSIS — G2569 Other tics of organic origin: Secondary | ICD-10-CM

## 2013-05-31 DIAGNOSIS — G0489 Other myelitis: Secondary | ICD-10-CM

## 2013-05-31 DIAGNOSIS — G373 Acute transverse myelitis in demyelinating disease of central nervous system: Secondary | ICD-10-CM

## 2013-05-31 MED ORDER — CLONAZEPAM 0.5 MG PO TABS: ORAL_TABLET | ORAL | Status: AC

## 2013-06-01 NOTE — Progress Notes (Signed)
Patient: [name] MRN: [id number] Sex: male DOB: 03/19/1956  Provider: Deetta Perla, MD Location of Care: [facility]  Note type: Routine return visit, empty, refill and reprogram intrthecal baclofen pump.  History of Present Illness: Referral Source: Dr. Beverely Risen  History from: patient and [facility] chart Chief Complaint: Emptying refill and reprogramming of intrathecal baclofen pump  [name] is a 57 y.o. male who returns for emptying, refilling, and reprogramming of intrathecal back pump.  The patient was seen on May 31, 2013, for the first time since March 15, 2013.  He was here to empty, refill, and reprogram his intrathecal baclofen pump, which took place.  He also complained bitterly of the pain that he has and tic like movements that occur as a result of the pain.  We have tried him on Haldol, which has failed and latter on Valium, which helped for awhile, but he became used to it.  He has asked for another medication and I prescribed clonazepam.  He also asked to have increase in the baclofen pump because he perceives greater spasticity in his limbs.  The procedure is dictated below.  Procedure interrogation, emptying, refilling and reprogramming of intrathecal baclofen pump.   His new pump was placed September 05, 2011. Serial number [id number], model [id number]  Concentration of baclofen is 2000 mcg/mL.  Basal rate 30.0 Mcg per hour.  He has 2 - 60 mcg bolus doses at 5 AM, 11 AM; 2 - 80 mcg at 5 PM, and 11 PM.  Total daily dose 969.5 mcg.  I increased his bolus doses to 80 mcg at 5, 11 AM, 5, and 11 PM.  I increased his basal rate to 32 mcg per hour.  Together this was about a 9% increase. I entered the pump on the 2nd pass. The reservoir was entered atraumatically. 5.0 mL was withdrawn placing the pump under partial vacuum.  Through a Millipore filter 40 mL of baclofen, concentration 2000 mcg/mL was instilled.  The pump was  reprogrammed to reflect the new volume and unchanged dosing rate.  Refill interval is 72 days, battery life is 60 months.  He tolerated the procedure well.  He will return on or before August 11, 2013.  Review of Systems: 12 system review was remarkable for chronic pain, motor tics, and spasticity.  Past Medical History  Diagnosis Date  . CHF (congestive heart failure)   . COPD (chronic obstructive pulmonary disease)   . Thoracic myelopathy   . Incontinence   . Insomnia   . Morbid obesity   . Spastic paraparesis   . Hypertension   . Heart murmur     was told yrs ago  . Shortness of breath     all the time  . Sleep apnea     set  at  9  . GERD (gastroesophageal reflux disease)   . Diabetes mellitus without complication   . Movement disorder   . Vision abnormalities    Hospitalizations: yes, Head Injury: no, Nervous System Infections: no, Immunizations up to date: yes Past Medical History Comments: none.  Behavior History depression  Surgical History Past Surgical History  Procedure Laterality Date  . Baclofen infusion pump    . Eye surgery    . Cardiac catheterization    . Pain pump implantation  09/05/2011    Procedure: PAIN PUMP INSERTION;  Surgeon: Cristi Loron, MD;  Location: [facility];  Service: Neurosurgery;  Laterality: N/A;  Baclofen Pump Replacement  .  Intrathecal baclofen pump    . 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 Family History is negative migraines, seizures, cognitive impairment, blindness, deafness, birth defects, chromosomal disorder, autism.  Social History History   Social History  . Marital Status: Married    Spouse Name: N/A    Number of Children: N/A  . Years of Education: N/A   Social History Main Topics  . Smoking status: Current Every Day Smoker -- 1.00 packs/day for 38 years    Types:  Cigarettes  . Smokeless tobacco: Not on file     Comment: Patient smokes 1 pack of cigarettes daily.  . Alcohol Use: 28.0 oz/week    56 drink(s) per week  . Drug Use: No  . Sexual Activity: Not on file   Other Topics Concern  . Not on file   Social History Narrative   Lives with wife.  No children.    Current Outpatient Prescriptions on File Prior to Visit  Medication Sig Dispense Refill  . diltiazem (CARDIZEM CD) 180 MG 24 hr capsule       . diltiazem (DILACOR XR) 180 MG 24 hr capsule Take 180 mg by mouth daily.      . Fluticasone-Salmeterol (ADVAIR) 250-50 MCG/DOSE AEPB Inhale 1 puff into the lungs 2 (two) times daily.      Marland Kitchen gabapentin (NEURONTIN) 300 MG capsule Take 1 capsule by mouth 4  times daily  360 capsule  3  . haloperidol (HALDOL) 1 MG tablet TAKE 1 AND 1/2 TABLETS BY MOUTH 4 TIMES A DAY  180 tablet  1  . IPRATROPIUM-ALBUTEROL IN Inhale 1 vial into the lungs 2 (two) times daily.      . Liraglutide (VICTOZA) 18 MG/3ML SOPN Inject 1 application into the skin daily.      Marland Kitchen lisinopril-hydrochlorothiazide (PRINZIDE,ZESTORETIC) 10-12.5 MG per tablet Take 1 tablet by mouth daily.      Marland Kitchen LORazepam (ATIVAN) 2 MG tablet One by mouth each bedtime  31 tablet  0  . oxyCODONE-acetaminophen (PERCOCET) 10-325 MG per tablet Take 1 tablet 4 times per day as needed for pain. Dx 724.4  124 tablet  0  . RABEprazole (ACIPHEX) 20 MG tablet Take 20 mg by mouth daily.      Marland Kitchen zolpidem (AMBIEN) 10 MG tablet Take 1 tablet at bedtime for insomnia  30 tablet  0   No current facility-administered medications on file prior to visit.   The medication list was reviewed and reconciled. All changes or newly prescribed medications were explained.  A complete medication list was provided to the patient/caregiver.  Allergies  Allergen Reactions  . Diamox [Acetazolamide] Rash    Physical Exam There were no vitals taken for this visit. The patient had significant spasticity in his right leg.  Was able to  extend it.  Spasticity is largely in the quadriceps less so in other muscles.  Assessment 1. 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 2. 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  Plan I emptied, refilled his pump, which is due to be refilled in August 11, 2013, and will have to be refilled on Friday, August 09, 2013.  I gave him clonazepam 0.5 mg q.i.d. and recommended that he try the medication and contact me.  If this fails, I think that he should go to a Movement Disorders Clinic, but I will  need to discuss his case before I refer him.  I spent 30 minutes of face-to-face time with the patient, some of it was in refilling the pump, others in discussing his issues of spasticity and tic disorder.    Deetta Perla MD

## 2013-06-02 ENCOUNTER — Encounter: Payer: Self-pay | Admitting: Pediatrics

## 2013-06-10 ENCOUNTER — Emergency Department: Payer: Self-pay | Admitting: Emergency Medicine

## 2013-06-10 LAB — COMPREHENSIVE METABOLIC PANEL
Albumin: 3.6 g/dL (ref 3.4–5.0)
Alkaline Phosphatase: 164 U/L — ABNORMAL HIGH
Anion Gap: 4 — ABNORMAL LOW (ref 7–16)
BUN: 19 mg/dL — ABNORMAL HIGH (ref 7–18)
Bilirubin,Total: 0.4 mg/dL (ref 0.2–1.0)
Calcium, Total: 10 mg/dL (ref 8.5–10.1)
Chloride: 107 mmol/L (ref 98–107)
Creatinine: 0.55 mg/dL — ABNORMAL LOW (ref 0.60–1.30)
EGFR (African American): >60
EGFR (Non-African Amer.): >60
Potassium: 3.7 mmol/L (ref 3.5–5.1)
Sodium: 137 mmol/L (ref 136–145)

## 2013-06-10 LAB — URINALYSIS, COMPLETE
Bilirubin,UR: NEGATIVE
Glucose,UR: NEGATIVE mg/dL (ref 0–75)
Ph: 5 (ref 4.5–8.0)
Protein: 30
RBC,UR: 52 /HPF (ref 0–5)
Specific Gravity: 1.013 (ref 1.003–1.030)
Squamous Epithelial: 1

## 2013-06-10 LAB — CBC
HCT: 50 % (ref 40.0–52.0)
MCH: 26.8 pg (ref 26.0–34.0)
MCV: 85 fL (ref 80–100)
RDW: 18.1 % — ABNORMAL HIGH (ref 11.5–14.5)
WBC: 14.9 10*3/uL — ABNORMAL HIGH (ref 3.8–10.6)

## 2013-06-19 ENCOUNTER — Emergency Department (HOSPITAL_COMMUNITY): Payer: 59

## 2013-06-19 ENCOUNTER — Inpatient Hospital Stay (HOSPITAL_COMMUNITY)
Admission: EM | Admit: 2013-06-19 | Discharge: 2013-06-20 | DRG: 193 | Disposition: A | Discharge status: Home / Self Care | Payer: 59 | Attending: Internal Medicine | Admitting: Internal Medicine

## 2013-06-19 ENCOUNTER — Encounter (HOSPITAL_COMMUNITY): Payer: Self-pay | Admitting: Emergency Medicine

## 2013-06-19 DIAGNOSIS — Z6837 Body mass index (BMI) 37.0-37.9, adult: Secondary | ICD-10-CM

## 2013-06-19 DIAGNOSIS — N390 Urinary tract infection, site not specified: Secondary | ICD-10-CM | POA: Diagnosis present

## 2013-06-19 DIAGNOSIS — F17200 Nicotine dependence, unspecified, uncomplicated: Secondary | ICD-10-CM | POA: Diagnosis present

## 2013-06-19 DIAGNOSIS — G373 Acute transverse myelitis in demyelinating disease of central nervous system: Secondary | ICD-10-CM

## 2013-06-19 DIAGNOSIS — E119 Type 2 diabetes mellitus without complications: Secondary | ICD-10-CM | POA: Diagnosis present

## 2013-06-19 DIAGNOSIS — K219 Gastro-esophageal reflux disease without esophagitis: Secondary | ICD-10-CM | POA: Diagnosis present

## 2013-06-19 DIAGNOSIS — G8929 Other chronic pain: Secondary | ICD-10-CM | POA: Diagnosis present

## 2013-06-19 DIAGNOSIS — F1110 Opioid abuse, uncomplicated: Secondary | ICD-10-CM | POA: Diagnosis present

## 2013-06-19 DIAGNOSIS — G4733 Obstructive sleep apnea (adult) (pediatric): Secondary | ICD-10-CM | POA: Diagnosis present

## 2013-06-19 DIAGNOSIS — G4700 Insomnia, unspecified: Secondary | ICD-10-CM

## 2013-06-19 DIAGNOSIS — Z23 Encounter for immunization: Secondary | ICD-10-CM

## 2013-06-19 DIAGNOSIS — D72829 Elevated white blood cell count, unspecified: Secondary | ICD-10-CM

## 2013-06-19 DIAGNOSIS — I10 Essential (primary) hypertension: Secondary | ICD-10-CM | POA: Diagnosis present

## 2013-06-19 DIAGNOSIS — G8220 Paraplegia, unspecified: Secondary | ICD-10-CM | POA: Diagnosis present

## 2013-06-19 DIAGNOSIS — J4489 Other specified chronic obstructive pulmonary disease: Secondary | ICD-10-CM | POA: Diagnosis present

## 2013-06-19 DIAGNOSIS — I5032 Chronic diastolic (congestive) heart failure: Secondary | ICD-10-CM | POA: Diagnosis present

## 2013-06-19 DIAGNOSIS — J449 Chronic obstructive pulmonary disease, unspecified: Secondary | ICD-10-CM | POA: Diagnosis present

## 2013-06-19 DIAGNOSIS — Z01810 Encounter for preprocedural cardiovascular examination: Secondary | ICD-10-CM

## 2013-06-19 DIAGNOSIS — G9340 Encephalopathy, unspecified: Secondary | ICD-10-CM

## 2013-06-19 DIAGNOSIS — Z79899 Other long term (current) drug therapy: Secondary | ICD-10-CM

## 2013-06-19 DIAGNOSIS — I509 Heart failure, unspecified: Secondary | ICD-10-CM

## 2013-06-19 DIAGNOSIS — J189 Pneumonia, unspecified organism: Principal | ICD-10-CM

## 2013-06-19 DIAGNOSIS — Z720 Tobacco use: Secondary | ICD-10-CM

## 2013-06-19 DIAGNOSIS — S78119A Complete traumatic amputation at level between unspecified hip and knee, initial encounter: Secondary | ICD-10-CM

## 2013-06-19 HISTORY — DX: Blindness, one eye, unspecified eye: H54.40

## 2013-06-19 HISTORY — DX: Pure hypercholesterolemia, unspecified: E78.00

## 2013-06-19 HISTORY — DX: Pneumonia, unspecified organism: J18.9

## 2013-06-19 HISTORY — DX: Type 2 diabetes mellitus without complications: E11.9

## 2013-06-19 LAB — CG4 I-STAT (LACTIC ACID): Lactic Acid, Venous: 2.67 mmol/L — ABNORMAL HIGH (ref 0.5–2.2)

## 2013-06-19 LAB — URINE MICROSCOPIC-ADD ON

## 2013-06-19 LAB — URINALYSIS, ROUTINE W REFLEX MICROSCOPIC
Bilirubin Urine: NEGATIVE
Hgb urine dipstick: NEGATIVE
Protein, ur: NEGATIVE mg/dL
Urobilinogen, UA: 0.2 mg/dL (ref 0.0–1.0)

## 2013-06-19 LAB — POCT I-STAT 3, ART BLOOD GAS (G3+)
Bicarbonate: 27.3 mEq/L — ABNORMAL HIGH (ref 20.0–24.0)
O2 Saturation: 87 %
Patient temperature: 97.9
TCO2: 29 mmol/L (ref 0–100)
pCO2 arterial: 51.2 mmHg — ABNORMAL HIGH (ref 35.0–45.0)
pH, Arterial: 7.334 — ABNORMAL LOW (ref 7.350–7.450)
pO2, Arterial: 57 mmHg — ABNORMAL LOW (ref 80.0–100.0)

## 2013-06-19 LAB — CBC
HCT: 47.1 % (ref 39.0–52.0)
Hemoglobin: 16 g/dL (ref 13.0–17.0)
MCH: 29.6 pg (ref 26.0–34.0)
MCV: 87.2 fL (ref 78.0–100.0)
Platelets: 264 10*3/uL (ref 150–400)
RBC: 5.4 MIL/uL (ref 4.22–5.81)
WBC: 20.1 10*3/uL — ABNORMAL HIGH (ref 4.0–10.5)

## 2013-06-19 LAB — SALICYLATE LEVEL: Salicylate Lvl: <2 mg/dL — ABNORMAL LOW (ref 2.8–20.0)

## 2013-06-19 LAB — RAPID URINE DRUG SCREEN, HOSP PERFORMED
Amphetamines: NOT DETECTED
Barbiturates: NOT DETECTED
Cocaine: NOT DETECTED
Tetrahydrocannabinol: NOT DETECTED

## 2013-06-19 LAB — COMPREHENSIVE METABOLIC PANEL
ALT: 17 U/L (ref 0–53)
AST: 23 U/L (ref 0–37)
CO2: 27 mEq/L (ref 19–32)
Chloride: 97 mEq/L (ref 96–112)
GFR calc Af Amer: 70 mL/min — ABNORMAL LOW (ref >90)
GFR calc non Af Amer: 60 mL/min — ABNORMAL LOW (ref >90)
Glucose, Bld: 110 mg/dL — ABNORMAL HIGH (ref 70–99)
Potassium: 3.8 mEq/L (ref 3.5–5.1)
Sodium: 137 mEq/L (ref 135–145)
Total Bilirubin: 0.3 mg/dL (ref 0.3–1.2)

## 2013-06-19 LAB — ACETAMINOPHEN LEVEL: Acetaminophen (Tylenol), Serum: <15 ug/mL (ref 10–30)

## 2013-06-19 LAB — POCT I-STAT TROPONIN I: Troponin i, poc: 0.03 ng/mL (ref 0.00–0.08)

## 2013-06-19 LAB — MRSA PCR SCREENING: MRSA by PCR: NEGATIVE

## 2013-06-19 LAB — GLUCOSE, CAPILLARY
Glucose-Capillary: 116 mg/dL — ABNORMAL HIGH (ref 70–99)
Glucose-Capillary: 116 mg/dL — ABNORMAL HIGH (ref 70–99)

## 2013-06-19 MED ORDER — NALOXONE HCL 0.4 MG/ML IJ SOLN
0.4000 mg | Freq: Once | INTRAMUSCULAR | Status: AC
  Administered 2013-06-19: 0.4 mg via INTRAVENOUS
  Filled 2013-06-19: qty 1

## 2013-06-19 MED ORDER — GUAIFENESIN ER 600 MG PO TB12
600.0000 mg | ORAL_TABLET | Freq: Two times a day (BID) | ORAL | Status: DC
  Administered 2013-06-19: 600 mg via ORAL
  Filled 2013-06-19 (×3): qty 1

## 2013-06-19 MED ORDER — PNEUMOCOCCAL VAC POLYVALENT 25 MCG/0.5ML IJ INJ
0.5000 mL | INJECTION | INTRAMUSCULAR | Status: DC
  Filled 2013-06-19: qty 0.5

## 2013-06-19 MED ORDER — ALBUTEROL SULFATE (5 MG/ML) 0.5% IN NEBU
2.5000 mg | INHALATION_SOLUTION | Freq: Three times a day (TID) | RESPIRATORY_TRACT | Status: DC
  Administered 2013-06-20: 08:00:00 2.5 mg via RESPIRATORY_TRACT
  Filled 2013-06-19: qty 0.5

## 2013-06-19 MED ORDER — IPRATROPIUM BROMIDE 0.02 % IN SOLN
0.5000 mg | Freq: Three times a day (TID) | RESPIRATORY_TRACT | Status: DC
  Administered 2013-06-20: 0.5 mg via RESPIRATORY_TRACT
  Filled 2013-06-19: qty 2.5

## 2013-06-19 MED ORDER — SODIUM CHLORIDE 0.9 % IJ SOLN
3.0000 mL | Freq: Two times a day (BID) | INTRAMUSCULAR | Status: DC
  Administered 2013-06-19: 22:00:00 3 mL via INTRAVENOUS

## 2013-06-19 MED ORDER — PIPERACILLIN-TAZOBACTAM 3.375 G IVPB
3.3750 g | Freq: Three times a day (TID) | INTRAVENOUS | Status: DC
  Filled 2013-06-19 (×2): qty 50

## 2013-06-19 MED ORDER — ENOXAPARIN SODIUM 40 MG/0.4ML ~~LOC~~ SOLN
40.0000 mg | SUBCUTANEOUS | Status: DC
  Filled 2013-06-19: qty 0.4

## 2013-06-19 MED ORDER — ALBUTEROL SULFATE (5 MG/ML) 0.5% IN NEBU
2.5000 mg | INHALATION_SOLUTION | Freq: Four times a day (QID) | RESPIRATORY_TRACT | Status: DC
  Administered 2013-06-19: 22:00:00 2.5 mg via RESPIRATORY_TRACT
  Filled 2013-06-19: qty 0.5

## 2013-06-19 MED ORDER — ENOXAPARIN SODIUM 60 MG/0.6ML ~~LOC~~ SOLN
50.0000 mg | SUBCUTANEOUS | Status: DC
  Administered 2013-06-19: 21:00:00 50 mg via SUBCUTANEOUS
  Filled 2013-06-19 (×2): qty 0.6

## 2013-06-19 MED ORDER — VANCOMYCIN HCL IN DEXTROSE 1-5 GM/200ML-% IV SOLN
1000.0000 mg | Freq: Two times a day (BID) | INTRAVENOUS | Status: DC
  Administered 2013-06-20: 1000 mg via INTRAVENOUS
  Filled 2013-06-19 (×2): qty 200

## 2013-06-19 MED ORDER — MOMETASONE FURO-FORMOTEROL FUM 200-5 MCG/ACT IN AERO
2.0000 | INHALATION_SPRAY | Freq: Two times a day (BID) | RESPIRATORY_TRACT | Status: DC
  Administered 2013-06-20: 2 via RESPIRATORY_TRACT
  Filled 2013-06-19: qty 8.8

## 2013-06-19 MED ORDER — DEXTROSE 5 % IV SOLN: 500.0000 mg | Freq: Once | INTRAVENOUS | Status: DC

## 2013-06-19 MED ORDER — SODIUM CHLORIDE 0.9 % IV SOLN: 250.0000 mL | INTRAVENOUS | Status: DC | PRN

## 2013-06-19 MED ORDER — PIPERACILLIN-TAZOBACTAM 3.375 G IVPB
3.3750 g | Freq: Three times a day (TID) | INTRAVENOUS | Status: DC
  Administered 2013-06-20: 3.375 g via INTRAVENOUS
  Filled 2013-06-19 (×4): qty 50

## 2013-06-19 MED ORDER — ACETAMINOPHEN 650 MG RE SUPP: 650.0000 mg | Freq: Four times a day (QID) | RECTAL | Status: DC | PRN

## 2013-06-19 MED ORDER — ONDANSETRON HCL 4 MG/2ML IJ SOLN: 4.0000 mg | Freq: Four times a day (QID) | INTRAMUSCULAR | Status: DC | PRN

## 2013-06-19 MED ORDER — SODIUM CHLORIDE 0.9 % IJ SOLN: 3.0000 mL | INTRAMUSCULAR | Status: DC | PRN

## 2013-06-19 MED ORDER — VANCOMYCIN HCL IN DEXTROSE 1-5 GM/200ML-% IV SOLN
1000.0000 mg | Freq: Once | INTRAVENOUS | Status: AC
  Administered 2013-06-19: 21:00:00 1000 mg via INTRAVENOUS
  Filled 2013-06-19 (×2): qty 200

## 2013-06-19 MED ORDER — DEXTROSE 5 % IV SOLN: 1.0000 g | Freq: Once | INTRAVENOUS | Status: DC

## 2013-06-19 MED ORDER — DILTIAZEM HCL ER COATED BEADS 180 MG PO CP24
180.0000 mg | ORAL_CAPSULE | Freq: Every day | ORAL | Status: DC
  Filled 2013-06-19 (×2): qty 1

## 2013-06-19 MED ORDER — ONDANSETRON HCL 4 MG PO TABS: 4.0000 mg | ORAL_TABLET | Freq: Four times a day (QID) | ORAL | Status: DC | PRN

## 2013-06-19 MED ORDER — KETOROLAC TROMETHAMINE 30 MG/ML IJ SOLN
30.0000 mg | Freq: Once | INTRAMUSCULAR | Status: AC
  Administered 2013-06-19: 22:00:00 30 mg via INTRAVENOUS
  Filled 2013-06-19: qty 1

## 2013-06-19 MED ORDER — SODIUM CHLORIDE 0.9 % IJ SOLN
3.0000 mL | Freq: Two times a day (BID) | INTRAMUSCULAR | Status: DC
  Administered 2013-06-19: 3 mL via INTRAVENOUS

## 2013-06-19 MED ORDER — VANCOMYCIN HCL IN DEXTROSE 1-5 GM/200ML-% IV SOLN
1000.0000 mg | Freq: Once | INTRAVENOUS | Status: AC
  Administered 2013-06-19: 1000 mg via INTRAVENOUS
  Filled 2013-06-19: qty 200

## 2013-06-19 MED ORDER — PIPERACILLIN-TAZOBACTAM 3.375 G IVPB 30 MIN
3.3750 g | Freq: Once | INTRAVENOUS | Status: AC
  Administered 2013-06-19: 3.375 g via INTRAVENOUS
  Filled 2013-06-19: qty 50

## 2013-06-19 MED ORDER — VANCOMYCIN HCL 10 G IV SOLR
2000.0000 mg | Freq: Once | INTRAVENOUS | Status: DC
  Filled 2013-06-19: qty 2000

## 2013-06-19 MED ORDER — IPRATROPIUM BROMIDE 0.02 % IN SOLN
0.5000 mg | Freq: Four times a day (QID) | RESPIRATORY_TRACT | Status: DC
  Administered 2013-06-19: 0.5 mg via RESPIRATORY_TRACT
  Filled 2013-06-19: qty 2.5

## 2013-06-19 MED ORDER — ACETAMINOPHEN 325 MG PO TABS
650.0000 mg | ORAL_TABLET | Freq: Four times a day (QID) | ORAL | Status: DC | PRN
  Administered 2013-06-20 (×2): 650 mg via ORAL
  Filled 2013-06-19 (×2): qty 2

## 2013-06-19 MED ORDER — NYSTATIN 100000 UNIT/ML MT SUSP
5.0000 mL | Freq: Four times a day (QID) | OROMUCOSAL | Status: DC
  Administered 2013-06-19: 500000 [IU] via ORAL
  Filled 2013-06-19 (×6): qty 5

## 2013-06-19 MED ORDER — INFLUENZA VAC SPLIT QUAD 0.5 ML IM SUSP
0.5000 mL | INTRAMUSCULAR | Status: DC
  Filled 2013-06-19: qty 0.5

## 2013-06-19 NOTE — H&P (Signed)
[facility] History and Physical  [name] ZOX:[id number] DOB: 07-18-1955 DOA: 06/19/2013  Referring physician: Johnnette Gourd PA.  PCP: Lyndon Code, MD   Chief Complaint: AMS.   HPI: [name] is a 57 y.o. male with PMH significant for COPD, Sleep apnea, Diabetes, Hypertension, Spatic paresis who presents with AMS. Patient presents to ED via EMS with AMS. Per ED report patient recently left [place] [facility] AMA because he was not given pain medications. He was started on hospice yesterday by PCP for unknown reason. Patient was Alert, no responding to painful stimuli. He received 2 mg of narcan by EMS. Patient received 0.4 mg IV of narcan in the ED after that he wake up. He is now alert and oriented. He relates dyspnea for last 2 week. He relates occasional cough. He denies chest pain. He denies taking more that usual his pain medications. He said he pass out this morning in the bathroom.    Review of Systems:  Negative except as per HPI.   Past Medical History  Diagnosis Date  . CHF (congestive heart failure)   . COPD (chronic obstructive pulmonary disease)   . Thoracic myelopathy   . Incontinence   . Insomnia   . Morbid obesity   . Spastic paraparesis   . Hypertension   . Heart murmur     was told yrs ago  . Shortness of breath     all the time  . Sleep apnea     set  at  9  . GERD (gastroesophageal reflux disease)   . Diabetes mellitus without complication   . Movement disorder   . Vision abnormalities    Past Surgical History  Procedure Laterality Date  . Baclofen infusion pump    . Eye surgery    . Cardiac catheterization    . Pain pump implantation  09/05/2011    Procedure: PAIN PUMP INSERTION;  Surgeon: Cristi Loron, MD;  Location: [facility];  Service: Neurosurgery;  Laterality: N/A;  Baclofen Pump Replacement  . Intrathecal baclofen pump    . 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  He has a 38 pack-year smoking history. He does not have any smokeless tobacco history on file. He reports that he drinks about 28.0 ounces of alcohol per week. He reports that he does not use illicit drugs.  Allergies  Allergen Reactions  . Diamox [Acetazolamide] Rash    Family History  Problem Relation Age of Onset  . Cancer Father 59  . Emphysema Mother 48     Prior to Admission medications   Medication Sig Start Date End Date Taking? Authorizing Provider  ciprofloxacin (CIPRO) 500 MG tablet Take 500 mg by mouth 2 (two) times daily.   Yes Historical Provider, MD  clonazePAM (KLONOPIN) 0.5 MG tablet One by mouth 4 times a day 05/31/13  Yes Deetta Perla, MD  diltiazem (CARDIZEM CD) 180 MG 24 hr capsule Take 360 mg by mouth daily.  12/12/12  Yes Historical Provider, MD  Fluticasone-Salmeterol (ADVAIR) 500-50 MCG/DOSE AEPB Inhale 1 puff into the lungs 2 (two) times daily.   Yes Historical Provider, MD  gabapentin (NEURONTIN) 300 MG capsule Take 1 capsule by mouth 4  times daily 05/04/13  Yes Elveria Rising, NP  glimepiride (AMARYL) 1 MG tablet Take 1 mg by mouth daily with breakfast.  03/25/13  Yes Historical Provider, MD  haloperidol (HALDOL) 1 MG tablet Take  1.5 mg by mouth 4 (four) times daily.   Yes Historical Provider, MD  IPRATROPIUM-ALBUTEROL IN Inhale 1 vial into the lungs 2 (two) times daily.   Yes Historical Provider, MD  Liraglutide (VICTOZA) 18 MG/3ML SOPN Inject 36 mg into the skin daily.    Yes Historical Provider, MD  lisinopril-hydrochlorothiazide (PRINZIDE,ZESTORETIC) 20-25 MG per tablet Take 1 tablet by mouth daily.   Yes Historical Provider, MD  nystatin (MYCOSTATIN) 100000 UNIT/ML suspension Take 5 mLs by mouth 4 (four) times daily. Swish and spit   Yes Historical Provider, MD  oxyCODONE-acetaminophen (PERCOCET) 10-325 MG per tablet Take 1 tablet 4 times per day as needed for pain. Dx 724.4 05/21/13  Yes Elveria Rising, NP  zolpidem (AMBIEN) 10  MG tablet Take 1 tablet at bedtime for insomnia 05/21/13  Yes Elveria Rising, NP   Physical Exam: Filed Vitals:   06/19/13 1645  BP: 128/40  Pulse: 77  Temp:   Resp: 8    BP 128/40  Pulse 77  Temp(Src) 97.9 F (36.6 C)  Resp 8  SpO2 95%  General:  Appears calm and comfortable, alert.  Eyes: PERRL, normal lids, irises & conjunctiva ENT: grossly normal hearing, lips & tongue dry. Neck: no LAD, masses or thyromegaly Cardiovascular: RRR, no m/r/g. No LE edema. Telemetry: SR, no arrhythmias  Respiratory: Normal respiratory effort, bilateral ronchus, no wheezes.  Abdomen: soft, ntnd Skin: no rash or induration seen on limited exam Musculoskeletal: grossly normal tone BUE/BLE Psychiatric: grossly normal mood and affect, speech fluent and appropriate Neurologic: grossly non-focal.           Labs on Admission:  Basic Metabolic Panel:  Recent Labs Lab 06/19/13 1405  NA 137  K 3.8  CL 97  CO2 27  GLUCOSE 110*  BUN 26*  CREATININE 1.30  CALCIUM 9.9   Liver Function Tests:  Recent Labs Lab 06/19/13 1405  AST 23  ALT 17  ALKPHOS 151*  BILITOT 0.3  PROT 7.9  ALBUMIN 3.4*   CBC:  Recent Labs Lab 06/19/13 1405  WBC 20.1*  HGB 16.0  HCT 47.1  MCV 87.2  PLT 264   Cardiac Enzymes: No results found for this basename: CKTOTAL, CKMB, CKMBINDEX, TROPONINI,  in the last 168 hours  BNP (last 3 results)  Recent Labs  10/05/12 0445  PROBNP 11.9   CBG:  Recent Labs Lab 06/19/13 1413  GLUCAP 116*  116*    Radiological Exams on Admission: Ct Head Wo Contrast  06/19/2013   CLINICAL DATA:  Hospice patient who contents became unresponsive at home.  EXAM: CT HEAD WITHOUT CONTRAST  TECHNIQUE: Contiguous axial images were obtained from the base of the skull through the vertex without intravenous contrast.  COMPARISON:  None.  FINDINGS: Ventricular system normal in size and appearance for age. 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 Old lacunar stroke in the vicinity of the left cerebellar peduncle. No mass lesion. No midline shift. No acute hemorrhage or hematoma. No extra-axial fluid collections. No evidence of acute infarction.  No focal osseous abnormality involving the skull. Prior bilateral maxillary medial antrostomy procedures. Prior bilateral ethmoidectomies. Very small mucous retention cyst or polyp at the base of the left maxillary sinus. Opacification of a solitary right anterior ethmoid air cell. Frontal sinuses, sphenoid sinuses, bilateral mastoid air cells, and bilateral middle ear cavities well aerated.  IMPRESSION: 1. No acute intracranial abnormality. 2. 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   Electronically Signed   By: Hulan Saas M.D.   On: 06/19/2013 16:01   Dg Chest Portable 1 View  06/19/2013   CLINICAL DATA:  Altered mental status.  Shortness of breath.  EXAM: PORTABLE CHEST - 1 VIEW  COMPARISON:  Chest x-ray 10/05/2012.  FINDINGS: Mediastinum and hilar structures are normal. Persistent prominent markings in the lung bases noted. This may represent scarring, mild atelectasis and/or pneumonia cannot be excluded. No pleural effusion or pneumothorax. Stable cardiomegaly. No pulmonary venous congestion .  IMPRESSION: Mild prominence of markings in the lung bases. A component of this may represent scarring however underlying mild atelectasis and/or pneumonia cannot be excluded.   Electronically Signed   By: Maisie Fus  Register   On: 06/19/2013 14:42    EKG: Independently reviewed. Left anterior fascicular block.   Assessment/Plan Active Problems:   Morbid obesity   Tobacco abuse   HAP (hospital-acquired pneumonia)   Leukocytosis   Encephalopathy acute   PNA (pneumonia)  1-Encephalopathy; probably related to polypharmacy. Patient respond to narcan. I will hold haldol, xanax, opioids. Will monitor for agitation to avoid  withdrawal. Nurse, staff aware. Need to reassess need to resume haldol in am.   2-PNA, Health care associated. He was recently hospitalized. I will continue with Zosyn and vancomycin to cover for health care associated PNA. Will order sputum culture. Blood culture ordered.   3-Leukocytosis; in setting of PNA. continue with IV antibiotics. Blood culture ordered.   4-COPD; current smoker. Counseling provided. Start nebulizer treatments.   5-Increase lactic acid; monitor for sepsis, BP has remain stable.   6-UTI; Zosyn should cover. Follow urine culture.   Code Status: Full Code.  Family Communication: Care discussed with patient and wife who was at bedside.  Disposition Plan: expect 3 to 4 days inpatient.   Time spent: 75 minutes.   [facility] Pager [phone]

## 2013-06-19 NOTE — Progress Notes (Addendum)
ANTIBIOTIC CONSULT NOTE - INITIAL  Pharmacy Consult for Vancomycin and Zosyn Indication: rule out pneumonia  Allergies  Allergen Reactions  . Diamox [Acetazolamide] Rash    Patient Measurements: Height: 5\' 6"  (167.6 cm) Weight: 233 lb 11 oz (106 kg) IBW/kg (Calculated) : 63.8 Adjusted Body Weight: 80.7 kg   Vital Signs: Temp: 98.1 F (36.7 C) (12/24 1743) Temp src: Oral (12/24 1743) BP: 130/52 mmHg (12/24 1743) Pulse Rate: 82 (12/24 1743) Intake/Output from previous day:   Intake/Output from this shift:    Labs:  Recent Labs  06/19/13 1405  WBC 20.1*  HGB 16.0  PLT 264  CREATININE 1.30   Estimated Creatinine Clearance: 73.3 ml/min (by C-G formula based on Cr of 1.3). No results found for this basename: VANCOTROUGH, VANCOPEAK, VANCORANDOM, GENTTROUGH, GENTPEAK, GENTRANDOM, TOBRATROUGH, TOBRAPEAK, TOBRARND, AMIKACINPEAK, AMIKACINTROU, AMIKACIN,  in the last 72 hours   Microbiology: No results found for this or any previous visit (from the past 720 hour(s)).  Medical History: Past Medical History  Diagnosis Date  . CHF (congestive heart failure)   . COPD (chronic obstructive pulmonary disease)   . Thoracic myelopathy   . Incontinence   . Insomnia   . Morbid obesity   . Spastic paraparesis   . Hypertension   . Heart murmur     was told yrs ago  . Shortness of breath     all the time  . Sleep apnea     set  at  9  . GERD (gastroesophageal reflux disease)   . Diabetes mellitus without complication   . Movement disorder   . Vision abnormalities     Medications:  Prescriptions prior to admission  Medication Sig Dispense Refill  . ciprofloxacin (CIPRO) 500 MG tablet Take 500 mg by mouth 2 (two) times daily.      . clonazePAM (KLONOPIN) 0.5 MG tablet One by mouth 4 times a day  124 tablet  5  . diltiazem (CARDIZEM CD) 180 MG 24 hr capsule Take 360 mg by mouth daily.       . Fluticasone-Salmeterol (ADVAIR) 500-50 MCG/DOSE AEPB Inhale 1 puff into the lungs  2 (two) times daily.      Marland Kitchen gabapentin (NEURONTIN) 300 MG capsule Take 1 capsule by mouth 4  times daily  360 capsule  3  . glimepiride (AMARYL) 1 MG tablet Take 1 mg by mouth daily with breakfast.       . haloperidol (HALDOL) 1 MG tablet Take 1.5 mg by mouth 4 (four) times daily.      Maximino Green[place] IN Inhale 1 vial into the lungs 2 (two) times daily.      . Liraglutide (VICTOZA) 18 MG/3ML SOPN Inject 36 mg into the skin daily.       Marland Kitchen lisinopril-hydrochlorothiazide (PRINZIDE,ZESTORETIC) 20-25 MG per tablet Take 1 tablet by mouth daily.      Marland Kitchen nystatin (MYCOSTATIN) 100000 UNIT/ML suspension Take 5 mLs by mouth 4 (four) times daily. Swish and spit      . oxyCODONE-acetaminophen (PERCOCET) 10-325 MG per tablet Take 1 tablet 4 times per day as needed for pain. Dx 724.4  124 tablet  0  . zolpidem (AMBIEN) 10 MG tablet Take 1 tablet at bedtime for insomnia  30 tablet  0   Scheduled:  . albuterol  2.5 mg Nebulization Q6H  . diltiazem  180 mg Oral Daily  . enoxaparin (LOVENOX) injection  50 mg Subcutaneous Q24H  . guaiFENesin  600 mg Oral BID  . ipratropium  0.5  mg Nebulization Q6H  . mometasone-formoterol  2 puff Inhalation BID  . nystatin  5 mL Oral QID  . sodium chloride  3 mL Intravenous Q12H  . sodium chloride  3 mL Intravenous Q12H   Assessment: 57yo M presenting on 12/24 with AMS, dyspnea x 2 weeks, and occasional cough. Pharmacy consulted to dose Zosyn and Vancomycin for suspected HCAP.  Pt currently afebrile, WBC elevated to 20.1, CrCl ~73 ml/min  Vancomycin 1 gm x 1 given in ER Zosyn 3.375 gm x 1 given in ER  Goal of Therapy:  Vancomycin trough level 15-20 mcg/ml  Plan:  - Give Vancomycin 1 gm IV x 1 to complete 2gm load, then cont with 1gm IV q12h  - Start Zosyn 3.375 gm IV q8h (infuse over 4 hrs) - Monitor temp, WBC, renal function, levels prn   Margie Billet, PharmD Clinical Pharmacist - Resident Pager: [phone] Pharmacy: [phone] 06/19/2013 7:43  PM

## 2013-06-19 NOTE — ED Notes (Signed)
Pt arrived by [facility] from home with Altered LOC. Pt is in hospice care and was not responding. Wife is at bedside, unsure of why pt is in hospice care.

## 2013-06-19 NOTE — ED Provider Notes (Signed)
CSN: [id number]     Arrival date & time 06/19/13  1347 History   First MD Initiated Contact with Patient 06/19/13 1355     Chief Complaint  Patient presents with  . Altered Mental Status   (Consider location/radiation/quality/duration/timing/severity/associated sxs/prior Treatment) HPI Comments: Patient is a 57 year old male with a past medical history of CHF, COPD, morbid obesity, spastic paraparesis, diabetes, HTN and sleep apnea who presents to emergency department via EMS with altered mental status. Patient is here with wife who is a poor historian and unable to provide much information of the history. He was placed on hospice yesterday, with unsure why. EMS gave 2 mg Narcan without any change. He has a history of opioid abuse. After a few minutes of being in the emergency department, patient goes from sleeping to alert and oriented x3. States he is having shortness of breath and cough. My attending Dr. Gwendolyn Grant spoke with patient's primary care physician Dr. Welton Flakes who states patient was placed on hospice yesterday, is a full code, is short of breath and has a chronic cough at baseline. Recently left [facility] AMA because he was not given pain medicine, he was seen there for altered mental status.  The history is provided by the patient and the spouse (PCP).    Past Medical History  Diagnosis Date  . CHF (congestive heart failure)   . COPD (chronic obstructive pulmonary disease)   . Thoracic myelopathy   . Incontinence   . Insomnia   . Morbid obesity   . Spastic paraparesis   . Hypertension   . Heart murmur     was told yrs ago  . Sleep apnea     set  at  9  . GERD (gastroesophageal reflux disease)   . Movement disorder   . High cholesterol   . Blind left eye   . Shortness of breath     "all the time" (06/19/2013)  . HCAP (healthcare-associated pneumonia) 05/2013    Hattie Perch 06/19/2013  . Type II diabetes mellitus    Past Surgical History  Procedure Laterality Date  .  Baclofen infusion pump      "multiple revisions and replacements, etc"   (06/19/2013)  . Eye surgery    . Cardiac catheterization    . Pain pump implantation  09/05/2011    Procedure: PAIN PUMP INSERTION;  Surgeon: Cristi Loron, MD;  Location: [facility];  Service: Neurosurgery;  Laterality: N/A;  Baclofen Pump Replacement  . Intrathecal baclofen pump    . 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 Cataract extraction, bilateral Bilateral   . Carpal tunnel release Bilateral    Family History  Problem Relation Age of Onset  . Cancer Father 35  . Emphysema Mother 53   History  Substance Use Topics  . Smoking status: Current Every Day Smoker -- 1.00 packs/day for 39 years    Types: Cigarettes  . Smokeless tobacco: Never Used  . Alcohol Use: 0.0 oz/week     Comment: 06/19/2013 "quit drinking several months ago; used to drink heavy"    Review of Systems  Constitutional: Positive for activity change.  Respiratory: Positive for cough and shortness of breath.   Psychiatric/Behavioral: Positive for confusion.  All other systems reviewed and are negative.    Allergies  Diamox  Home Medications   Current Outpatient Rx  Name  Route  Sig  Dispense  Refill  . clonazePAM (KLONOPIN) 0.5 MG tablet      One by mouth 4  times a day   124 tablet   5   . diltiazem (CARDIZEM CD) 180 MG 24 hr capsule   Oral   Take 360 mg by mouth daily.          . Fluticasone-Salmeterol (ADVAIR) 500-50 MCG/DOSE AEPB   Inhalation   Inhale 1 puff into the lungs 2 (two) times daily.         Marland Kitchen gabapentin (NEURONTIN) 300 MG capsule      Take 1 capsule by mouth 4  times daily   360 capsule   3   . glimepiride (AMARYL) 1 MG tablet   Oral   Take 1 mg by mouth daily with breakfast.          . haloperidol (HALDOL) 1 MG tablet   Oral   Take 1.5 mg by mouth 4 (four) times daily.         Maximino Green[place] IN   Inhalation   Inhale 1 vial into the lungs 2 (two) times daily.          . Liraglutide (VICTOZA) 18 MG/3ML SOPN   Subcutaneous   Inject 36 mg into the skin daily.          Marland Kitchen lisinopril-hydrochlorothiazide (PRINZIDE,ZESTORETIC) 20-25 MG per tablet   Oral   Take 1 tablet by mouth daily.         Marland Kitchen nystatin (MYCOSTATIN) 100000 UNIT/ML suspension   Oral   Take 5 mLs by mouth 4 (four) times daily. Swish and spit         . oxyCODONE-acetaminophen (PERCOCET) 10-325 MG per tablet      Take 1 tablet 4 times per day as needed for pain. Dx 724.4   124 tablet   0   . levofloxacin (LEVAQUIN) 750 MG tablet   Oral   Take 1 tablet (750 mg total) by mouth daily.   7 tablet   0   . zolpidem (AMBIEN) 10 MG tablet      Take 1 tablet at bedtime as needed for insomnia/sleep.          BP 129/54  Pulse 91  Temp(Src) 98.8 F (37.1 C) (Oral)  Resp 18  Ht 5\' 6"  (1.676 m)  Wt 233 lb 11 oz (106 kg)  BMI 37.74 kg/m2  SpO2 93% Physical Exam  Nursing note and vitals reviewed. Constitutional: He is oriented to person, place, and time. He appears well-developed and well-nourished. No distress.  HENT:  Head: Normocephalic and atraumatic.  Mouth/Throat: Oropharynx is clear and moist.  Eyes: Conjunctivae are normal.  Blind in L eye. Right eye PERRLA, EOMI.  Neck: Normal range of motion. Neck supple.  Cardiovascular: Normal rate, regular rhythm and normal heart sounds.   Pulmonary/Chest: Effort normal. No respiratory distress. He has wheezes. He has rhonchi.  Scattered wheezes and ronchi. Labored breathing. Harsh cough present.  Abdominal: Soft. Normal appearance and bowel sounds are normal. There is no tenderness.    Musculoskeletal: Normal range of motion. He exhibits no edema.  Left above knee amputation.  Neurological: He is alert and oriented to person, place, and time. He has normal strength. No sensory deficit. Coordination normal. GCS eye subscore is 4. GCS verbal subscore is 5. GCS motor subscore is 6.  Skin: Skin is warm and dry. He is not  diaphoretic.  Psychiatric: He has a normal mood and affect. His behavior is normal.    ED Course  Procedures (including critical care time) Labs Review Labs Reviewed  GLUCOSE, CAPILLARY - Abnormal;  Notable for the following:    Glucose-Capillary 116 (*)    All other components within normal limits  GLUCOSE, CAPILLARY - Abnormal; Notable for the following:    Glucose-Capillary 116 (*)    All other components within normal limits  CBC - Abnormal; Notable for the following:    WBC 20.1 (*)    RDW 17.0 (*)    All other components within normal limits  COMPREHENSIVE METABOLIC PANEL - Abnormal; Notable for the following:    Glucose, Bld 110 (*)    BUN 26 (*)    Albumin 3.4 (*)    Alkaline Phosphatase 151 (*)    GFR calc non Af Amer 60 (*)    GFR calc Af Amer 70 (*)    All other components within normal limits  URINALYSIS, ROUTINE W REFLEX MICROSCOPIC - Abnormal; Notable for the following:    Leukocytes, UA MODERATE (*)    All other components within normal limits  SALICYLATE LEVEL - Abnormal; Notable for the following:    Salicylate Lvl <2.0 (*)    All other components within normal limits  URINE MICROSCOPIC-ADD ON - Abnormal; Notable for the following:    Squamous Epithelial / LPF FEW (*)    Casts HYALINE CASTS (*)    All other components within normal limits  COMPREHENSIVE METABOLIC PANEL - Abnormal; Notable for the following:    Glucose, Bld 105 (*)    BUN 24 (*)    Albumin 2.8 (*)    Alkaline Phosphatase 141 (*)    GFR calc non Af Amer 72 (*)    GFR calc Af Amer 84 (*)    All other components within normal limits  CBC - Abnormal; Notable for the following:    WBC 16.2 (*)    RDW 16.9 (*)    All other components within normal limits  CG4 I-STAT (LACTIC ACID) - Abnormal; Notable for the following:    Lactic Acid, Venous 2.67 (*)    All other components within normal limits  POCT I-STAT 3, BLOOD GAS (G3+) - Abnormal; Notable for the following:    pH, Arterial 7.334 (*)     pCO2 arterial 51.2 (*)    pO2, Arterial 57.0 (*)    Bicarbonate 27.3 (*)    All other components within normal limits  CULTURE, BLOOD (ROUTINE X 2)  CULTURE, BLOOD (ROUTINE X 2)  MRSA PCR SCREENING  URINE CULTURE  CULTURE, EXPECTORATED SPUTUM-ASSESSMENT  URINE RAPID DRUG SCREEN (HOSP PERFORMED)  ETHANOL  ACETAMINOPHEN LEVEL  POCT I-STAT TROPONIN I   Imaging Review Ct Head Wo Contrast  06/19/2013   CLINICAL DATA:  Hospice patient who contents became unresponsive at home.  EXAM: CT HEAD WITHOUT CONTRAST  TECHNIQUE: Contiguous axial images were obtained from the base of the skull through the vertex without intravenous contrast.  COMPARISON:  None.  FINDINGS: Ventricular system normal in size and appearance for age. 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 Old lacunar stroke in the vicinity of the left cerebellar peduncle. No mass lesion. No midline shift. No acute hemorrhage or hematoma. No extra-axial fluid collections. No evidence of acute infarction.  No focal osseous abnormality involving the skull. Prior bilateral maxillary medial antrostomy procedures. Prior bilateral ethmoidectomies. Very small mucous retention cyst or polyp at the base of the left maxillary sinus. Opacification of a solitary right anterior ethmoid air cell. Frontal sinuses, sphenoid sinuses, bilateral mastoid air cells, and bilateral middle ear cavities well aerated.  IMPRESSION: 1. No  acute intracranial abnormality. 2. Old lacunar strokes in both basal ganglia, right greater than left, and in the vicinity of the left cerebellar peduncle.   Electronically Signed   By: Hulan Saas M.D.   On: 06/19/2013 16:01   Dg Chest Portable 1 View  06/19/2013   CLINICAL DATA:  Altered mental status.  Shortness of breath.  EXAM: PORTABLE CHEST - 1 VIEW  COMPARISON:  Chest x-ray 10/05/2012.  FINDINGS: Mediastinum and hilar structures are normal. Persistent prominent markings in the lung bases noted.  This may represent scarring, mild atelectasis and/or pneumonia cannot be excluded. No pleural effusion or pneumothorax. Stable cardiomegaly. No pulmonary venous congestion .  IMPRESSION: Mild prominence of markings in the lung bases. A component of this may represent scarring however underlying mild atelectasis and/or pneumonia cannot be excluded.   Electronically Signed   By: Maisie Fus  Register   On: 06/19/2013 14:42    EKG Interpretation    Date/Time:  Wednesday June 19 2013 13:53:25 EST Ventricular Rate:  66 PR Interval:  159 QRS Duration: 110 QT Interval:  393 QTC Calculation: 412 R Axis:   -62 Text Interpretation:  Sinus rhythm Left anterior fascicular block ST elevation, consider inferior injury Confirmed by Gwendolyn Grant  MD, BLAIR ([id number]) on 06/19/2013 2:20:35 PM            MDM   1. HAP (hospital-acquired pneumonia)   2. Encephalopathy acute   3. Leukocytosis   4. Insomnia, unspecified   5. Morbid obesity   6. Tobacco abuse   7. PNA (pneumonia)   8. CHF (congestive heart failure)   9. Preop cardiovascular exam   10. Transverse myelitis     Pt presenting with AMS, on hospice. No change with Narcan given by EMS or additional in ED. About 30 minutes after initial evaluation, he wakes from sleep and is AAOx3. Left  [facility] earlier this week with AMS due to him not getting pain medication. Vitals stable, O2 sat 98% on RA. CXR obtained due to AMS and wheezes/ronchi, suspicion for pneumonia. Labs pending- cbc, cmp, lactate, uds, ua, blood culture, troponin, salicylate level, acetaminophen level. CT head pending. Will start abx for pneumonia, vanc/zosyn due to recent hospital admission. He is a full code.  Lactate elevated, leukocytosis 20.1, UA with infection. Pt admitted for HAP and further care, admission accepted by Dr. Sunnie Nielsen, [facility]   Case discussed with attending Dr. Gwendolyn Grant who also evaluated patient and agrees with plan of care.    Trevor Mace, PA-C 06/20/13  1531

## 2013-06-20 DIAGNOSIS — F17200 Nicotine dependence, unspecified, uncomplicated: Secondary | ICD-10-CM

## 2013-06-20 LAB — CBC
MCH: 28.9 pg (ref 26.0–34.0)
MCHC: 33.6 g/dL (ref 30.0–36.0)
MCV: 86.2 fL (ref 78.0–100.0)
Platelets: 289 10*3/uL (ref 150–400)

## 2013-06-20 LAB — COMPREHENSIVE METABOLIC PANEL
ALT: 19 U/L (ref 0–53)
AST: 24 U/L (ref 0–37)
Albumin: 2.8 g/dL — ABNORMAL LOW (ref 3.5–5.2)
CO2: 26 mEq/L (ref 19–32)
Calcium: 9.6 mg/dL (ref 8.4–10.5)
Chloride: 97 mEq/L (ref 96–112)
GFR calc Af Amer: 84 mL/min — ABNORMAL LOW (ref >90)
GFR calc non Af Amer: 72 mL/min — ABNORMAL LOW (ref >90)
Potassium: 3.6 mEq/L (ref 3.5–5.1)
Sodium: 137 mEq/L (ref 135–145)
Total Bilirubin: 0.3 mg/dL (ref 0.3–1.2)

## 2013-06-20 LAB — URINE CULTURE

## 2013-06-20 MED ORDER — LEVOFLOXACIN 750 MG PO TABS: 750.0000 mg | ORAL_TABLET | Freq: Every day | ORAL | Status: AC

## 2013-06-20 MED ORDER — KETOROLAC TROMETHAMINE 30 MG/ML IJ SOLN
30.0000 mg | Freq: Once | INTRAMUSCULAR | Status: AC
  Administered 2013-06-20: 05:00:00 30 mg via INTRAVENOUS
  Filled 2013-06-20: qty 1

## 2013-06-20 MED ORDER — ZOLPIDEM TARTRATE 10 MG PO TABS: ORAL_TABLET | ORAL | Status: DC

## 2013-06-20 NOTE — Discharge Summary (Signed)
Physician Discharge Summary  [name] ZOX:[id number] DOB: 1955/10/31 DOA: 06/19/2013  PCP: Lyndon Code, MD  Admit date: 06/19/2013 Discharge date: 06/20/2013  Time spent: Greater than 30 minutes  Recommendations for Outpatient Follow-up:  1. Dr. Beverely Risen, PCP in 5 days. 2. Resume home hospice services: Alerted Palliative care M.D. on call to assist with same. 3. Continue nightly CPAP had prior settings. 4. Patient & spouse advised that he followup with his primary neurologist (Dr. Ellison Carwin), pulmonologist (? Dr. Minerva Areola at [facility]) 5. Recommend repeating chest x-ray in 4-6 weeks to ensure resolution of pneumonia findings. 6. Followup blood cultures x2 in urine culture sent to the hospital on 06/19/13. 7. Follow CBC in one week  Discharge Diagnoses:  Active Problems:   Morbid obesity   Tobacco abuse   HAP (hospital-acquired pneumonia)   Leukocytosis   Encephalopathy acute   PNA (pneumonia)   Discharge Condition: Improved & Stable  Diet recommendation: Heart healthy and diabetic diet.  Filed Weights   06/19/13 1743  Weight: 106 kg (233 lb 11 oz)    History of present illness:  56 year old male with history of COPD, OSA on nightly CPAP, ongoing tobacco abuse, morbid obesity, right spastic paraparesis, left AKA, hypertension, chronic pain, chronic CHF, opioid abuse admitted on 06/19/13 with altered mental status. His primary pulmonologist apparently arrange for home hospice recently but patient doesn't think that he needs it. He apparently did not respond to Narcan given by EMS or additional dose provided in the ED but spontaneously woke up in the ED and was coherent. He states that he may have forgotten that he took his pain medications and may have taken an additional dose yesterday. He recently left AMA from [facility] hospital because he was not given pain medications. He also complained of some cough and dyspnea but according to his PCP has some of these  symptoms chronically. He was admitted for management of altered mental status and possible healthcare associated pneumonia. He had recently been started on oral Cipro for UTI.  Hospital Course:   Acute encephalopathy - Likely related to polypharmacy. After extensive discussion with patient and spouse this morning, they indicate that there have been no recent change in his medications and he has been taking most of these medications chronically. He has not had such change in mental status before. As stated above, he thinks he might have taken an extra dose of pain medications inadvertently. CT head did not show any acute findings. Mental status changes have resolved and he has returned to his baseline on exam and also according to his wife. Patient has been counseled extensively regarding avoiding excess/overdose of medications. He denies suicidal or homicidal ideations. He states that from now on his wife will keep and administer the medications as prescribed which is a good plan. They've been advised to seek immediate medical attention if he has similar symptoms at which point his medications may have to be adjusted. They verbalized understanding.  Possible healthcare associated pneumonia - Patient has cough with minimal white sputum, chronic dyspnea which is unchanged. His mild leukocytosis but no fever. Patient refuses to remain in hospital and threatened to leave AMA if he is not discharge. At this time he appears stable with stable vital signs and respiratory status. He was initially placed on IV vancomycin and Zosyn which will be changed to oral Levaquin to complete additional 7 days treatment. Recommend repeating chest x-ray in 4-6 weeks to ensure resolution of pneumonia findings.  Leukocytosis -  Stress response versus secondary to pneumonia. Management as above. Follow up CBC as outpatient periodically.  COPD/tobacco abuse - Cessation counseled. Stable. Continue home regimen at discharge.  Oxygen saturations were 93-94% on room air.  OSA - Continue nightly CPAP an outpatient followup with primary pulmonologist   Presumed UTI  - Cipro will be changed to oral levofloxacin at discharge.   Spastic paraparesis/chronic pain - Patient sees neurology for his baclofen pump management, pain medications and anxiolytics. - Management as above. Patient was not given any new prescription for these medications. - It is strange that despite patient claiming to take opioid and benzodiazepines, his UDS is negative.  Type II DM -Continue home medications.   Chronic diastolic CHF  - Compensated.   Consultations:  None   Procedures:  None    Discharge Exam:  Complaints:  Patient states that he feels fine and back to his usual and demands to go home. He threatens to sign out AMA if he is not discharge. He has chronic dyspnea and cough which he says is unchanged. No fever or chest pain. His spouse agrees that his mental status is back to baseline.   Filed Vitals:   06/19/13 1743 06/20/13 0120 06/20/13 0742 06/20/13 1023  BP: 130/52 129/54    Pulse: 82 91    Temp: 98.1 F (36.7 C) 98.8 F (37.1 C)    TempSrc: Oral Oral    Resp: 8 18    Height: 5\' 6"  (1.676 m)     Weight: 106 kg (233 lb 11 oz)     SpO2: 93% 94% 98% 93%    General exam: Moderately built and obese male patient seen sitting comfortably on bed.  Respiratory system:  slightly reduced breath sounds with occasional rhonchi at posteriorly but rest of lung fields are clear to auscultation . No increased work of breathing. able to speak in full sentences.  Cardiovascular system: S1 & S2 heard, RRR. No JVD, murmurs, gallops, clicks or pedal edema. telemetry: Sinus rhythm.  Gastrointestinal system: Abdomen is obese , soft and nontender. Normal bowel sounds heard. Baclofen pump appreciated subcutaneously in the periumbilical region-no acute findings. Right sided laparotomy scar.  Central nervous system: Alert and  oriented. No focal neurological deficits. Extremities: Symmetric 5 x 5 power  in upper extremities. Right lower extremity 0/5 power and flexion contractures at knee . Left AKA stump without acute findings.  Discharge Instructions      Discharge Orders   Future Appointments Provider Department Dept Phone   08/09/2013 4:30 PM Deetta Perla, MD [facility] [phone]   Future Orders Complete By Expires   Call MD for:  difficulty breathing, headache or visual disturbances  As directed    Call MD for:  extreme fatigue  As directed    Call MD for:  persistant dizziness or light-headedness  As directed    Call MD for:  severe uncontrolled pain  As directed    Call MD for:  temperature >100.4  As directed    Call MD for:  As directed    Comments:     Altered mental status or confusion.   Diet - low sodium heart healthy  As directed    Diet Carb Modified  As directed    Discharge instructions  As directed    Comments:     Continue nightly CPAP at prior settings.   Increase activity slowly  As directed        Medication List    STOP  taking these medications       ciprofloxacin 500 MG tablet  Commonly known as:  CIPRO      TAKE these medications       clonazePAM 0.5 MG tablet  Commonly known as:  KLONOPIN  One by mouth 4 times a day     diltiazem 180 MG 24 hr capsule  Commonly known as:  CARDIZEM CD  Take 360 mg by mouth daily.     Fluticasone-Salmeterol 500-50 MCG/DOSE Aepb  Commonly known as:  ADVAIR  Inhale 1 puff into the lungs 2 (two) times daily.     gabapentin 300 MG capsule  Commonly known as:  NEURONTIN  Take 1 capsule by mouth 4  times daily     glimepiride 1 MG tablet  Commonly known as:  AMARYL  Take 1 mg by mouth daily with breakfast.     haloperidol 1 MG tablet  Commonly known as:  HALDOL  Take 1.5 mg by mouth 4 (four) times daily.     IPRATROPIUM-ALBUTEROL IN  Inhale 1 vial into the lungs 2 (two) times daily.     levofloxacin  750 MG tablet  Commonly known as:  LEVAQUIN  Take 1 tablet (750 mg total) by mouth daily.     lisinopril-hydrochlorothiazide 20-25 MG per tablet  Commonly known as:  PRINZIDE,ZESTORETIC  Take 1 tablet by mouth daily.     nystatin 100000 UNIT/ML suspension  Commonly known as:  MYCOSTATIN  Take 5 mLs by mouth 4 (four) times daily. Swish and spit     oxyCODONE-acetaminophen 10-325 MG per tablet  Commonly known as:  PERCOCET  Take 1 tablet 4 times per day as needed for pain. Dx 724.4     VICTOZA 18 MG/3ML Sopn  Generic drug:  Liraglutide  Inject 36 mg into the skin daily.     zolpidem 10 MG tablet  Commonly known as:  AMBIEN  Take 1 tablet at bedtime as needed for insomnia/sleep.       Follow-up Information   Follow up with Lyndon Code, MD. Schedule an appointment as soon as possible for a visit in 5 days.   Specialty:  Internal Medicine   Contact information:   [address] [place] [address] [phone]        The results of significant diagnostics from this hospitalization (including imaging, microbiology, ancillary and laboratory) are listed below for reference.    Significant Diagnostic Studies: Ct Head Wo Contrast  06/19/2013   CLINICAL DATA:  Hospice patient who contents became unresponsive at home.  EXAM: CT HEAD WITHOUT CONTRAST  TECHNIQUE: Contiguous axial images were obtained from the base of the skull through the vertex without intravenous contrast.  COMPARISON:  None.  FINDINGS: Ventricular system normal in size and appearance for age. 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 Old lacunar stroke in the vicinity of the left cerebellar peduncle. No mass lesion. No midline shift. No acute hemorrhage or hematoma. No extra-axial fluid collections. No evidence of acute infarction.  No focal osseous abnormality involving the skull. Prior bilateral maxillary medial antrostomy procedures. Prior bilateral ethmoidectomies. Very small  mucous retention cyst or polyp at the base of the left maxillary sinus. Opacification of a solitary right anterior ethmoid air cell. Frontal sinuses, sphenoid sinuses, bilateral mastoid air cells, and bilateral middle ear cavities well aerated.  IMPRESSION: 1. No acute intracranial abnormality. 2. 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   Electronically Signed   By: Hulan Saas M.D.   On: 06/19/2013 16:01   Dg Chest Portable 1 View  06/19/2013   CLINICAL DATA:  Altered mental status.  Shortness of breath.  EXAM: PORTABLE CHEST - 1 VIEW  COMPARISON:  Chest x-ray 10/05/2012.  FINDINGS: Mediastinum and hilar structures are normal. Persistent prominent markings in the lung bases noted. This may represent scarring, mild atelectasis and/or pneumonia cannot be excluded. No pleural effusion or pneumothorax. Stable cardiomegaly. No pulmonary venous congestion .  IMPRESSION: Mild prominence of markings in the lung bases. A component of this may represent scarring however underlying mild atelectasis and/or pneumonia cannot be excluded.   Electronically Signed   By: Maisie Fus  Register   On: 06/19/2013 14:42    Microbiology: Recent Results (from the past 240 hour(s))  CULTURE, BLOOD (ROUTINE X 2)     Status: None   Collection Time    06/19/13  3:20 PM      Result Value Range Status   Specimen Description BLOOD HAND RIGHT   Final   Special Requests     Final   Value: BOTTLES DRAWN AEROBIC AND ANAEROBIC BLUE 10CC RED 6CC   Culture  Setup Time     Final   Value: 06/19/2013 20:53     Performed at [facility]   Culture     Final   Value:        BLOOD CULTURE RECEIVED NO GROWTH TO DATE CULTURE WILL BE HELD FOR 5 DAYS BEFORE ISSUING A FINAL NEGATIVE REPORT     Performed at [facility]   Report Status PENDING   Incomplete  CULTURE, BLOOD (ROUTINE X 2)     Status: None   Collection Time    06/19/13  3:30 PM       Result Value Range Status   Specimen Description BLOOD FOREARM RIGHT   Final   Special Requests BOTTLES DRAWN AEROBIC ONLY 10CC   Final   Culture  Setup Time     Final   Value: 06/19/2013 20:53     Performed at [facility]   Culture     Final   Value:        BLOOD CULTURE RECEIVED NO GROWTH TO DATE CULTURE WILL BE HELD FOR 5 DAYS BEFORE ISSUING A FINAL NEGATIVE REPORT     Performed at [facility]   Report Status PENDING   Incomplete  MRSA PCR SCREENING     Status: None   Collection Time    06/19/13  8:11 PM      Result Value Range Status   MRSA by PCR NEGATIVE  NEGATIVE Final   Comment:            The GeneXpert MRSA Assay (FDA     approved for NASAL specimens     only), is one component of a     comprehensive MRSA colonization     surveillance program. It is not     intended to diagnose MRSA     infection nor to guide or     monitor treatment for     MRSA infections.     Labs: Basic Metabolic Panel:  Recent Labs Lab 06/19/13 1405 06/20/13 0552  NA 137 137  K 3.8 3.6  CL 97 97  CO2 27 26  GLUCOSE 110* 105*  BUN 26* 24*  CREATININE 1.30 1.12  CALCIUM 9.9 9.6   Liver Function Tests:  Recent Labs  Lab 06/19/13 1405 06/20/13 0552  AST 23 24  ALT 17 19  ALKPHOS 151* 141*  BILITOT 0.3 0.3  PROT 7.9 7.0  ALBUMIN 3.4* 2.8*   No results found for this basename: LIPASE, AMYLASE,  in the last 168 hours No results found for this basename: AMMONIA,  in the last 168 hours CBC:  Recent Labs Lab 06/19/13 1405 06/20/13 0552  WBC 20.1* 16.2*  HGB 16.0 13.8  HCT 47.1 41.1  MCV 87.2 86.2  PLT 264 289   Cardiac Enzymes: No results found for this basename: CKTOTAL, CKMB, CKMBINDEX, TROPONINI,  in the last 168 hours BNP: BNP (last 3 results)  Recent Labs  10/05/12 0445  PROBNP 11.9   CBG:  Recent Labs Lab 06/19/13 1413  GLUCAP 116*  116*    Additional labs: 1. ABG: PH 7.334, PCO2 51, PO2 57, bicarbonate 27 and oxygen saturation  87% 2. Lactic acid: 2.67 3. Salicylate level <2. Acetaminophen level <15 4. UDS: Negative.  5. Blood alcohol level <11    Signed:  Marcellus Scott, MD, FACP, FHM. [facility] Pager [phone]  If 7PM-7AM, please contact night-coverage [email or web address] Password [facility] 06/20/2013, 11:39 AM

## 2013-06-20 NOTE — Progress Notes (Signed)
12/24/1401900-0700 shift. Pt.is A/Ox4 initially he appeared to have some drowsiness at the beginning of the shift which gradually decreased during the night. Pt.during this time was still A/Ox4 and would respond to voice. Pt.is non-ambulatory and uses motorized wheelchair at home. Pt.is incontinent of urine. He is on 2 L [place] of oxygen and is also receiving IV antibiotics. He had some c/o lower back pain during the shift. Mid level provider with [facility] called, notified and orders given. Was told MD would further address home pain medications in the am. During the shift around 0300 patient c/o SOB went to patient's room to assess. Oxygen level was 90% on 2 L [place]. Lung sounds were clear upper lobes and diminished lower lobes with no crackles or wheezing. Patient then proceeds to say in the presence of his wife " Can you keep a secret?" Asked patient what he meant and inquired further. The patient begins to show me his electronic cigarette. Pt.said that he was taking puffs of it during the shift. Pt.and pt.'s wife were educated on the fact that this campus is tobacco free. Pt.then gave his electronic cigarette to his wife to take home. Told patient I could notify the doctor about his SOB and see if anything could be ordered but he refused.

## 2013-06-20 NOTE — Progress Notes (Signed)
Pt stated on 1st round that he may leave if doctor does not discharge him home today.  Dr. Waymon Amato informed and rounded.  Pt called to request IVs be taken out and he is going home against medical advise.  Laurey Morale, Charge nurse assisting with process.

## 2013-06-20 NOTE — Progress Notes (Signed)
02 sat = 93-94% on room air.  Dr. Waymon Amato will be paged to inform.  Pt stated he will wait until 12 noon for discharge papers.  Patient refused all 10 am meds, including influenza vaccine per Wynelle Cleveland,  Charge nurse that attempted to administer meds.  Refused 9am antibiotic for me.

## 2013-06-20 NOTE — ED Provider Notes (Signed)
Medical screening examination/treatment/procedure(s) were conducted as a shared visit with non-physician practitioner(s) and myself.  I personally evaluated the patient during the encounter.  EKG Interpretation    Date/Time:  Wednesday June 19 2013 13:53:25 EST Ventricular Rate:  66 PR Interval:  159 QRS Duration: 110 QT Interval:  393 QTC Calculation: 412 R Axis:   -62 Text Interpretation:  Sinus rhythm Left anterior fascicular block ST elevation, consider inferior injury Confirmed by Gwendolyn Grant  MD, Myles Tavella ([id number]) on 06/19/2013 2:20:35 PM             Patient here with altered mental status. I spoke with his PCP, who stated he get into hospice yesterday for COPD, opioid dependency. Was recently admitted to another hospital for AMS and left AMA after he didn't get pain meds. Does not have a DNR in place. Patient here somnolent initially, but after several minutes, is talking and oriented. Vitals stable. Labs show UTI. Admitted.   Dagmar Hait, MD 06/20/13 1537

## 2013-06-20 NOTE — Progress Notes (Signed)
Patient [name]      DOB: May 17, 1956      WUJ:[id number]  Called by Dr. Waymon Amato regarding patient's enrollment in hospice.  Confirmed patient is enrolled with [facility] .  Alerted them regarding his admission and potential discharge.  Call if we can assist further.  Daiki Dicostanzo L. Ladona Ridgel, MD MBA The Palliative Medicine Team at [facility] Phone: [phone] Pager: [phone]

## 2013-06-21 ENCOUNTER — Telehealth: Payer: Self-pay | Admitting: *Deleted

## 2013-06-21 DIAGNOSIS — G839 Paralytic syndrome, unspecified: Secondary | ICD-10-CM

## 2013-06-21 DIAGNOSIS — M4714 Other spondylosis with myelopathy, thoracic region: Secondary | ICD-10-CM

## 2013-06-21 DIAGNOSIS — G4700 Insomnia, unspecified: Secondary | ICD-10-CM

## 2013-06-21 DIAGNOSIS — IMO0002 Reserved for concepts with insufficient information to code with codable children: Secondary | ICD-10-CM

## 2013-06-21 DIAGNOSIS — M79609 Pain in unspecified limb: Secondary | ICD-10-CM

## 2013-06-21 MED ORDER — ZOLPIDEM TARTRATE 10 MG PO TABS: ORAL_TABLET | ORAL | Status: AC

## 2013-06-21 MED ORDER — OXYCODONE-ACETAMINOPHEN 10-325 MG PO TABS: ORAL_TABLET | ORAL | Status: AC

## 2013-06-21 NOTE — Telephone Encounter (Signed)
[name] called to inform us that he needs refills on his medications Oxycodone 10-325 take 1 tab by mouth four times a day as needed for pain and Zolpiden 10 mg take 1 at bedtime for insomnia. Martie Lee his wife will come by to pick up Rx's.         Thanks,  [name].

## 2013-06-21 NOTE — Telephone Encounter (Signed)
Prescriptions were printed.  We are not going to taper his medication.  Review of the hospitalization suggested that the patient is going to have his wife keep the medications and administer them as she has in the past.  I don't know why he changed from that.  I suspect that he demanded that she give him more medication, and he had an overdose because of his elevated CO2.

## 2013-06-25 LAB — CULTURE, BLOOD (ROUTINE X 2)
Culture: NO GROWTH
Culture: NO GROWTH

## 2013-06-27 ENCOUNTER — Ambulatory Visit: Payer: Self-pay | Admitting: Internal Medicine

## 2013-07-03 ENCOUNTER — Inpatient Hospital Stay: Payer: Self-pay | Admitting: Internal Medicine

## 2013-07-03 LAB — URINALYSIS, COMPLETE
BILIRUBIN, UR: NEGATIVE
Glucose,UR: NEGATIVE mg/dL (ref 0–75)
Ketone: NEGATIVE
NITRITE: NEGATIVE
Ph: 5 (ref 4.5–8.0)
Specific Gravity: 1.018 (ref 1.003–1.030)
WBC UR: 25 /HPF (ref 0–5)

## 2013-07-03 LAB — COMPREHENSIVE METABOLIC PANEL
ALT: 19 U/L (ref 12–78)
ANION GAP: 4 — AB (ref 7–16)
Albumin: 3.3 g/dL — ABNORMAL LOW (ref 3.4–5.0)
Alkaline Phosphatase: 139 U/L — ABNORMAL HIGH
BILIRUBIN TOTAL: 0.2 mg/dL (ref 0.2–1.0)
BUN: 20 mg/dL — AB (ref 7–18)
CREATININE: 1.49 mg/dL — AB (ref 0.60–1.30)
Calcium, Total: 9.5 mg/dL (ref 8.5–10.1)
Chloride: 107 mmol/L (ref 98–107)
Co2: 25 mmol/L (ref 21–32)
EGFR (African American): >60
EGFR (Non-African Amer.): 52 — ABNORMAL LOW
Glucose: 220 mg/dL — ABNORMAL HIGH (ref 65–99)
OSMOLALITY: 281 (ref 275–301)
Potassium: 3.8 mmol/L (ref 3.5–5.1)
SGOT(AST): 39 U/L — ABNORMAL HIGH (ref 15–37)
Sodium: 136 mmol/L (ref 136–145)
TOTAL PROTEIN: 8.1 g/dL (ref 6.4–8.2)

## 2013-07-03 LAB — CBC
HCT: 46.4 % (ref 40.0–52.0)
HGB: 15.1 g/dL (ref 13.0–18.0)
MCH: 27.7 pg (ref 26.0–34.0)
MCHC: 32.5 g/dL (ref 32.0–36.0)
MCV: 85 fL (ref 80–100)
Platelet: 365 10*3/uL (ref 150–440)
RBC: 5.45 10*6/uL (ref 4.40–5.90)
RDW: 17.5 % — ABNORMAL HIGH (ref 11.5–14.5)
WBC: 22.7 10*3/uL — AB (ref 3.8–10.6)

## 2013-07-03 LAB — ETHANOL
Ethanol %: <0.003 % (ref 0.000–0.080)
Ethanol: <3 mg/dL

## 2013-07-03 LAB — DRUG SCREEN, URINE
Amphetamines, Ur Screen: NEGATIVE (ref ?–1000)
Barbiturates, Ur Screen: NEGATIVE (ref ?–200)
Benzodiazepine, Ur Scrn: NEGATIVE (ref ?–200)
Cannabinoid 50 Ng, Ur ~~LOC~~: NEGATIVE (ref ?–50)
Cocaine Metabolite,Ur ~~LOC~~: NEGATIVE (ref ?–300)
MDMA (ECSTASY) UR SCREEN: NEGATIVE (ref ?–500)
Methadone, Ur Screen: NEGATIVE (ref ?–300)
OPIATE, UR SCREEN: POSITIVE (ref ?–300)
Phencyclidine (PCP) Ur S: NEGATIVE (ref ?–25)
Tricyclic, Ur Screen: NEGATIVE (ref ?–1000)

## 2013-07-03 LAB — ACETAMINOPHEN LEVEL: Acetaminophen: <2 ug/mL

## 2013-07-03 LAB — RAPID INFLUENZA A&B ANTIGENS (ARMC ONLY)

## 2013-07-04 LAB — BASIC METABOLIC PANEL
ANION GAP: 5 — AB (ref 7–16)
BUN: 17 mg/dL (ref 7–18)
CO2: 25 mmol/L (ref 21–32)
Calcium, Total: 8.6 mg/dL (ref 8.5–10.1)
Chloride: 110 mmol/L — ABNORMAL HIGH (ref 98–107)
Creatinine: 1.26 mg/dL (ref 0.60–1.30)
EGFR (African American): >60
Glucose: 226 mg/dL — ABNORMAL HIGH (ref 65–99)
Osmolality: 288 (ref 275–301)
Potassium: 3.4 mmol/L — ABNORMAL LOW (ref 3.5–5.1)
Sodium: 140 mmol/L (ref 136–145)

## 2013-07-04 LAB — CBC WITH DIFFERENTIAL/PLATELET
Basophil #: 0 10*3/uL (ref 0.0–0.1)
Basophil %: 0.1 %
EOS PCT: 0 %
Eosinophil #: 0 10*3/uL (ref 0.0–0.7)
HCT: 40.4 % (ref 40.0–52.0)
HGB: 13.1 g/dL (ref 13.0–18.0)
LYMPHS ABS: 0.4 10*3/uL — AB (ref 1.0–3.6)
Lymphocyte %: 1.4 %
MCH: 27.5 pg (ref 26.0–34.0)
MCHC: 32.4 g/dL (ref 32.0–36.0)
MCV: 85 fL (ref 80–100)
MONO ABS: 0.5 x10 3/mm (ref 0.2–1.0)
Monocyte %: 1.8 %
NEUTROS ABS: 24.1 10*3/uL — AB (ref 1.4–6.5)
NEUTROS PCT: 96.7 %
Platelet: 358 10*3/uL (ref 150–440)
RBC: 4.77 10*6/uL (ref 4.40–5.90)
RDW: 18 % — ABNORMAL HIGH (ref 11.5–14.5)
WBC: 25 10*3/uL — ABNORMAL HIGH (ref 3.8–10.6)

## 2013-07-04 LAB — URINE CULTURE

## 2013-07-05 LAB — BASIC METABOLIC PANEL
ANION GAP: 3 — AB (ref 7–16)
BUN: 15 mg/dL (ref 7–18)
CALCIUM: 9.3 mg/dL (ref 8.5–10.1)
CHLORIDE: 108 mmol/L — AB (ref 98–107)
CREATININE: 0.84 mg/dL (ref 0.60–1.30)
Co2: 28 mmol/L (ref 21–32)
EGFR (Non-African Amer.): >60
Glucose: 152 mg/dL — ABNORMAL HIGH (ref 65–99)
Osmolality: 281 (ref 275–301)
Potassium: 3.1 mmol/L — ABNORMAL LOW (ref 3.5–5.1)
Sodium: 139 mmol/L (ref 136–145)

## 2013-07-05 LAB — CBC WITH DIFFERENTIAL/PLATELET
BASOS ABS: 0 10*3/uL (ref 0.0–0.1)
Basophil %: 0.1 %
EOS ABS: 0 10*3/uL (ref 0.0–0.7)
EOS PCT: 0 %
HCT: 37.8 % — AB (ref 40.0–52.0)
HGB: 12.9 g/dL — ABNORMAL LOW (ref 13.0–18.0)
LYMPHS ABS: 0.9 10*3/uL — AB (ref 1.0–3.6)
LYMPHS PCT: 4.8 %
MCH: 28.6 pg (ref 26.0–34.0)
MCHC: 34.2 g/dL (ref 32.0–36.0)
MCV: 84 fL (ref 80–100)
Monocyte #: 1.2 x10 3/mm — ABNORMAL HIGH (ref 0.2–1.0)
Monocyte %: 5.9 %
NEUTROS ABS: 17.7 10*3/uL — AB (ref 1.4–6.5)
Neutrophil %: 89.2 %
PLATELETS: 317 10*3/uL (ref 150–440)
RBC: 4.52 10*6/uL (ref 4.40–5.90)
RDW: 18 % — AB (ref 11.5–14.5)
WBC: 19.8 10*3/uL — ABNORMAL HIGH (ref 3.8–10.6)

## 2013-07-05 LAB — MAGNESIUM: Magnesium: 2.1 mg/dL

## 2013-07-05 LAB — PHOSPHORUS: Phosphorus: 2.6 mg/dL (ref 2.5–4.9)

## 2013-07-06 LAB — EXPECTORATED SPUTUM ASSESSMENT W REFEX TO RESP CULTURE

## 2013-07-06 LAB — CULTURE, BLOOD (SINGLE)

## 2013-07-08 LAB — CULTURE, BLOOD (SINGLE)

## 2013-07-28 ENCOUNTER — Ambulatory Visit: Payer: Self-pay | Admitting: Internal Medicine

## 2013-07-28 DEATH — deceased

## 2013-08-09 ENCOUNTER — Encounter: Payer: 59 | Admitting: Pediatrics

## 2014-03-03 IMAGING — CR DG ABDOMEN 2V
1 series · 5 of 5 positions shown · non-contrast
Comparison: none

REASON FOR EXAM: constipation?
COMMENTS:

[Series 1: x abdomen erect · 0.14mm/px · 5 of 5 slices shown]
[im 1/5]
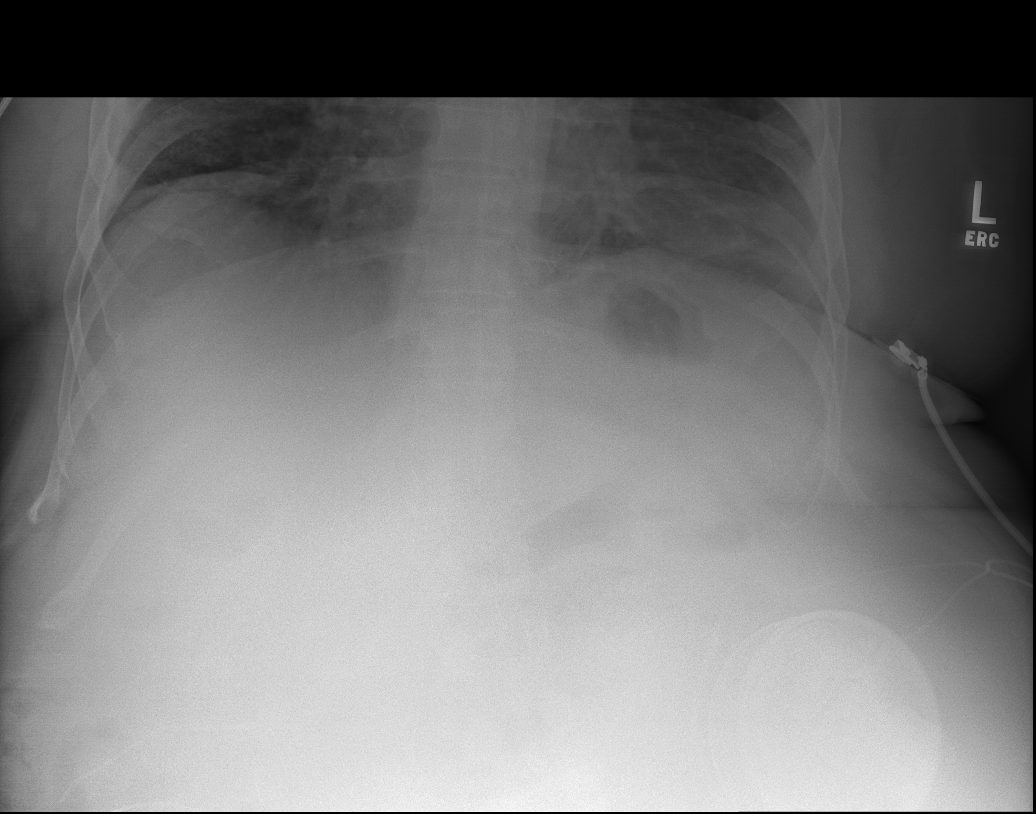
[im 2/5]
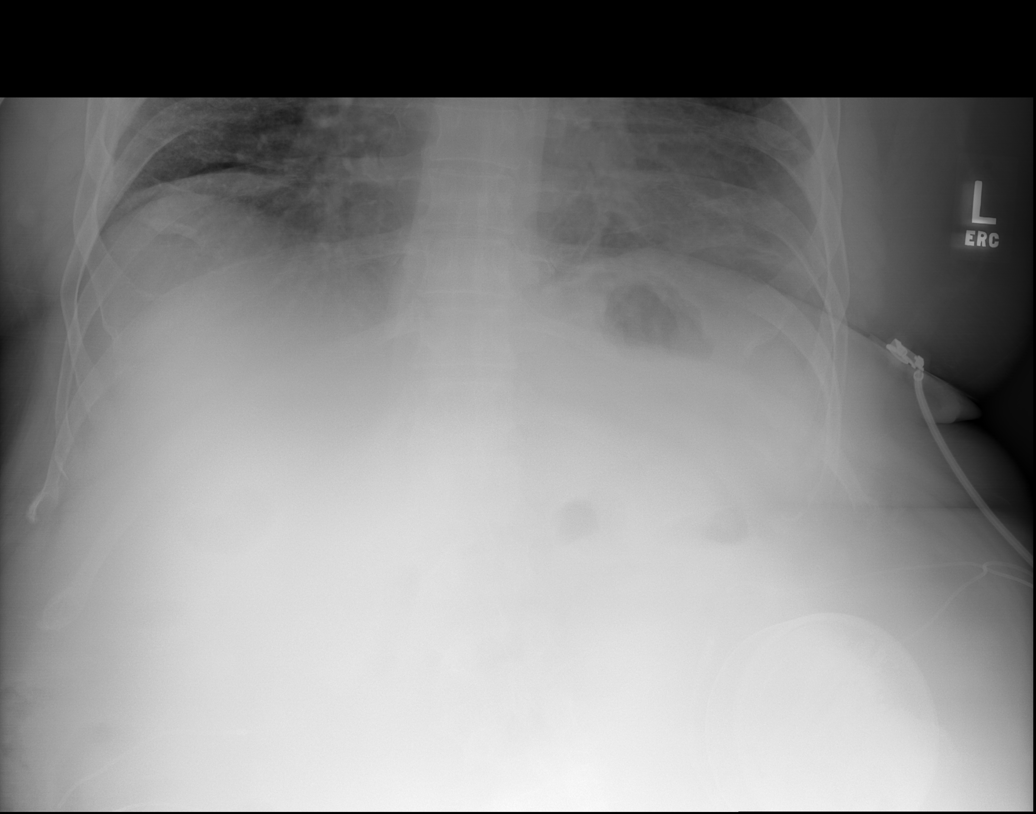
[im 3/5]
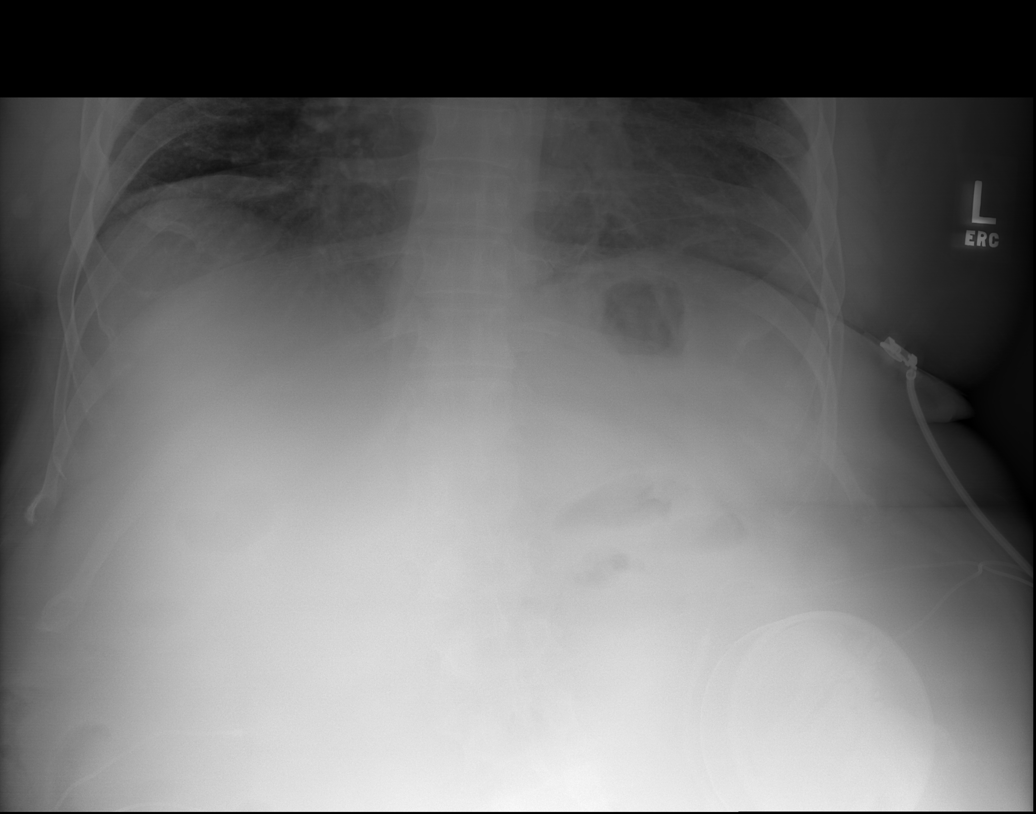
[im 4/5]
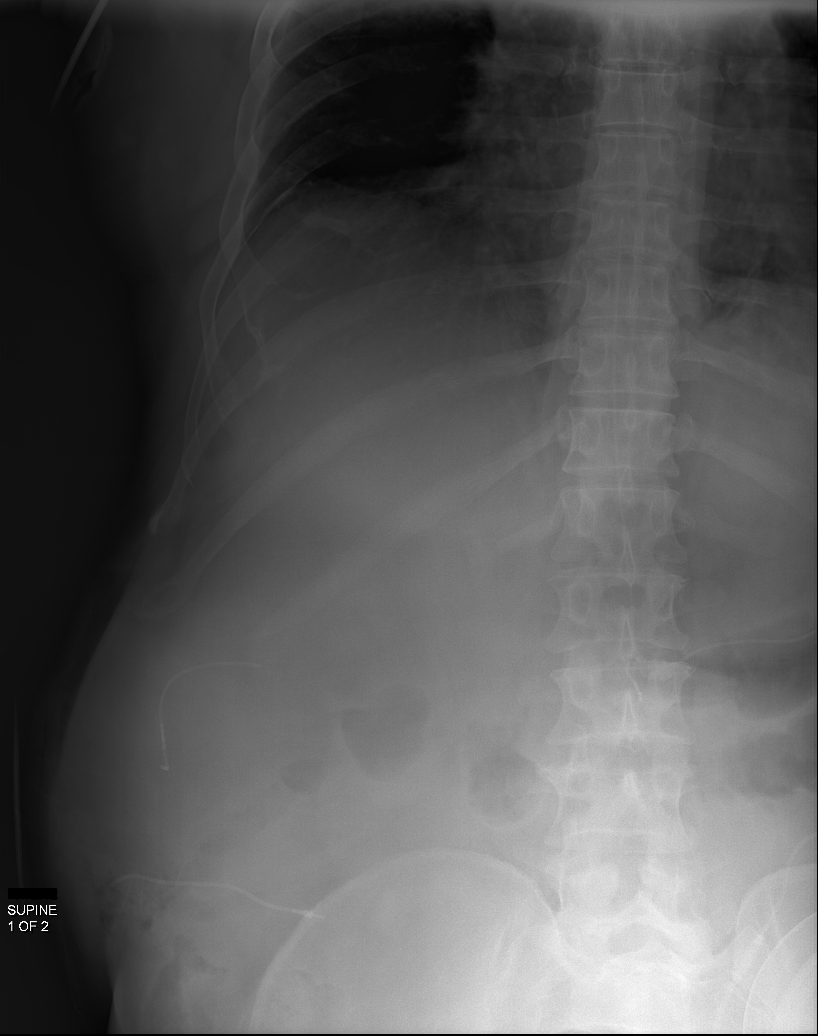
[im 5/5]
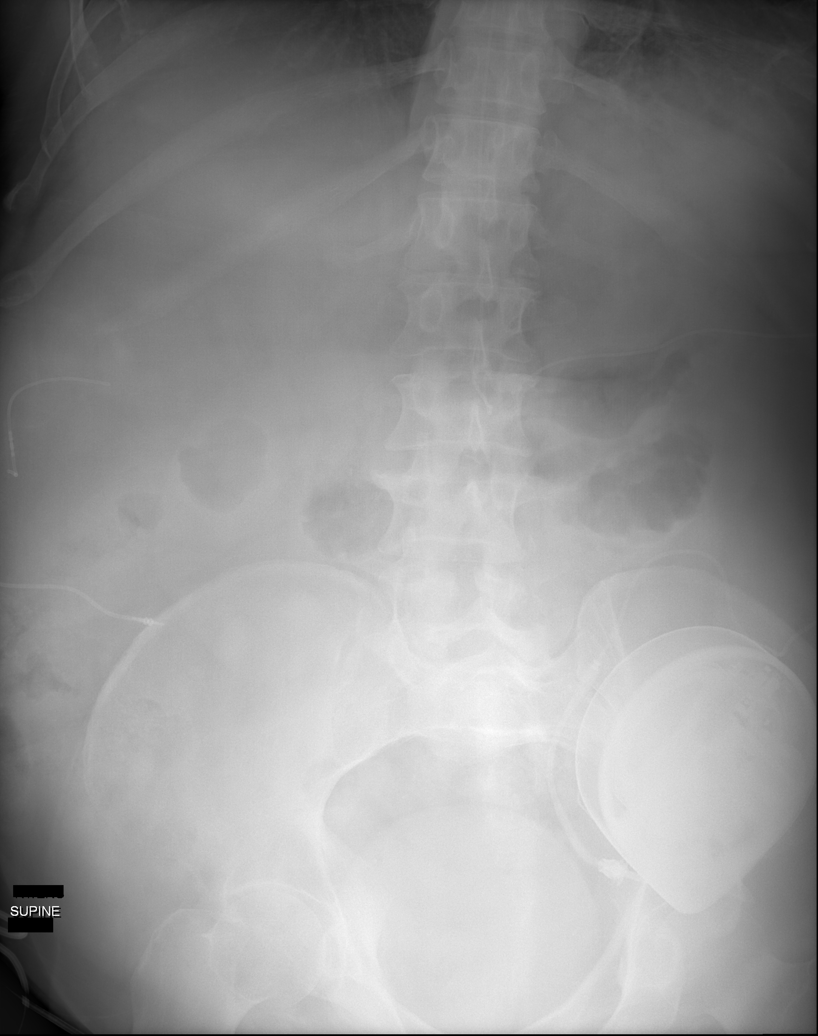

[5 of 5 positions shown; findings below may reference images not displayed]

PROCEDURE:     DXR - DXR ABDOMEN 2 V FLAT AND ERECT  - October 13, 2012 [DATE]

RESULT:     The study is limited because the patient's body habitus. There
does not appear to be significant abnormal bowel distention. Density over
the left pelvis region is likely the a implanted pump. Correlate with
history. There is no free air. No abnormal gastric distention is seen.
Monitoring electrodes are present.
IMPRESSION: 1. No definite evidence of bowel obstruction or perforation. Very limited
study.

[REDACTED]

## 2014-03-09 IMAGING — CT CT HEAD WITHOUT CONTRAST
1 series · 16 of 30 positions shown, 20 images · non-contrast
Comparison: none

REASON FOR EXAM: ams/
COMMENTS:

PROCEDURE:     CT  - CT HEAD WITHOUT CONTRAST  - October 19, 2012  [DATE]
RESULT:     Comparison:  None
TECHNIQUE: Multiple axial images from the foramen magnum to the vertex were
obtained without IV contrast.

[Series 2: soft tissue · axial · 0.44mm/px · z∈[-196,-51]mm · 16 of 33 slices shown, 20 images]
[im 2/33  brain]
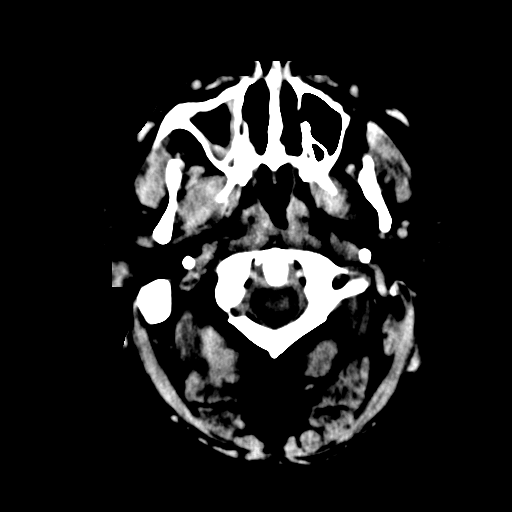
[im 2/33  bone]
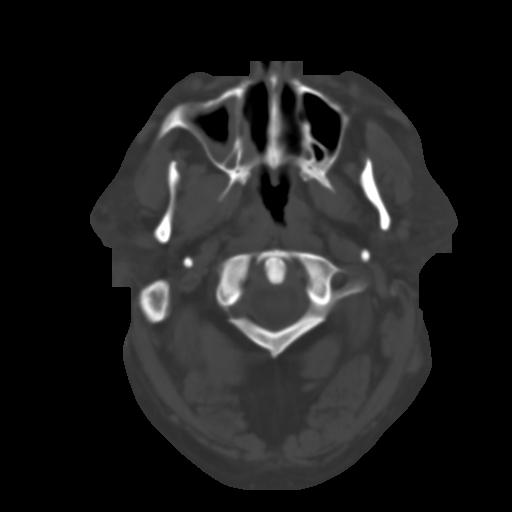
[im 4/33  brain]
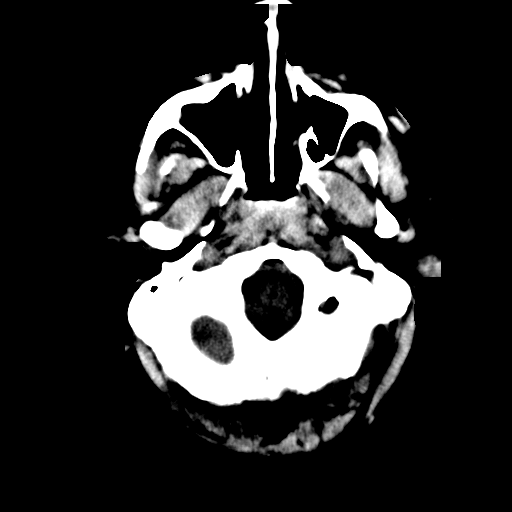
[im 6/33  brain]
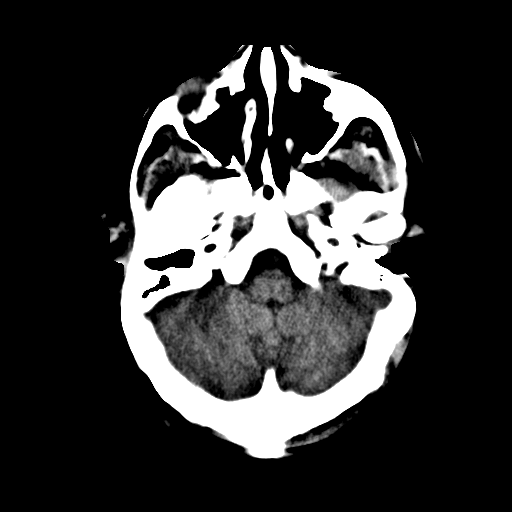
[im 8/33  brain]
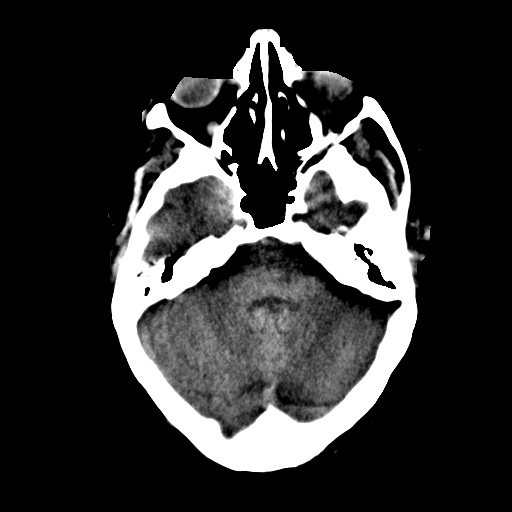
[im 9/33  brain]
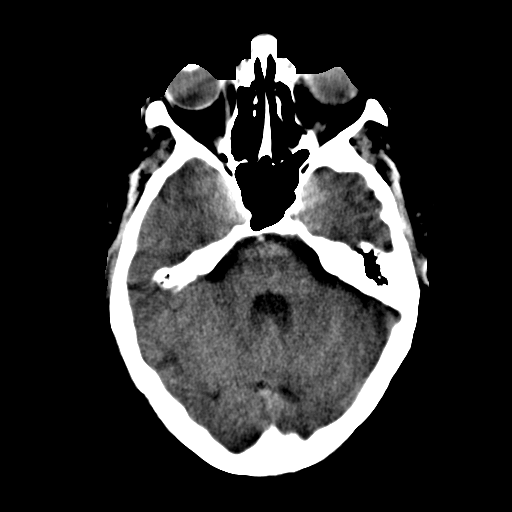
[im 9/33  bone]
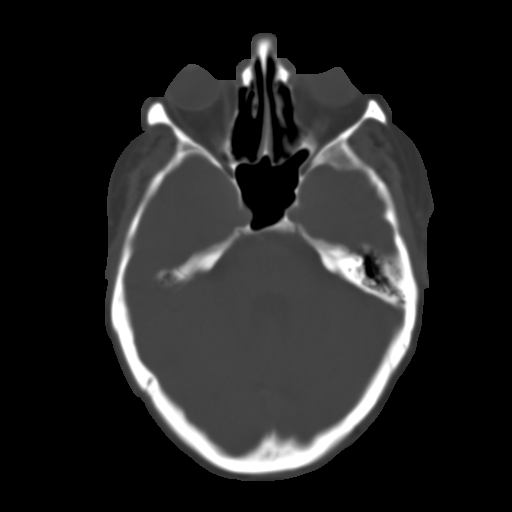
[im 12/33  brain]
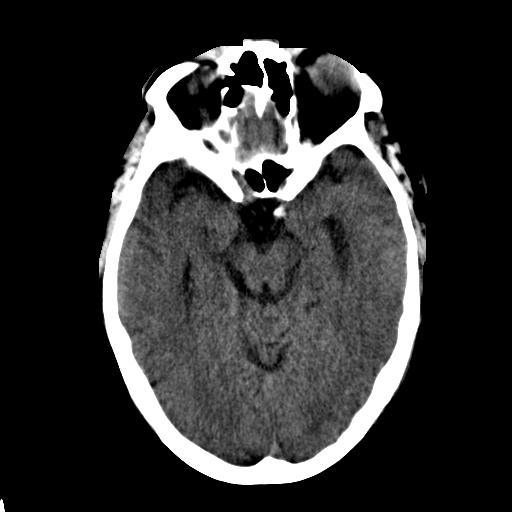
[im 14/33  brain]
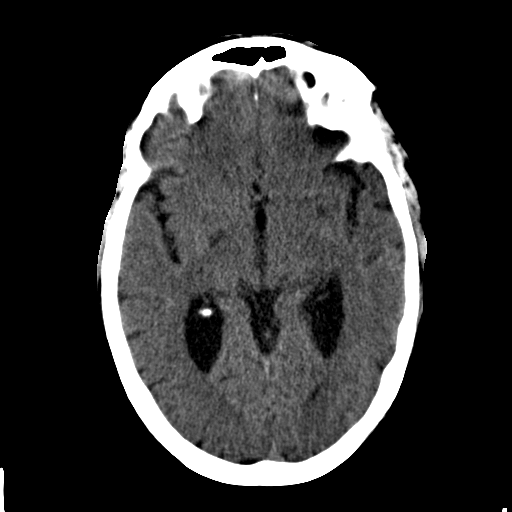
[im 16/33  brain]
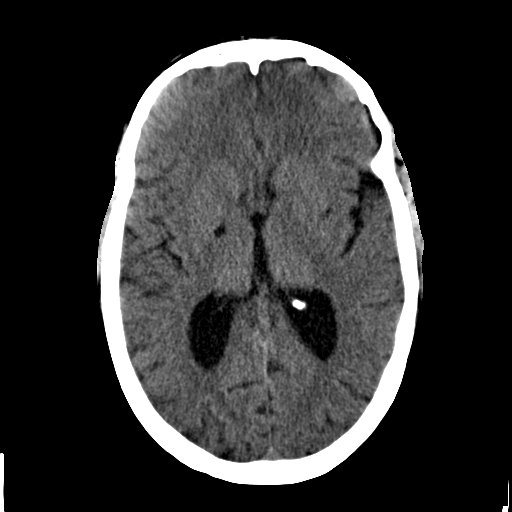
[im 17/33  brain]
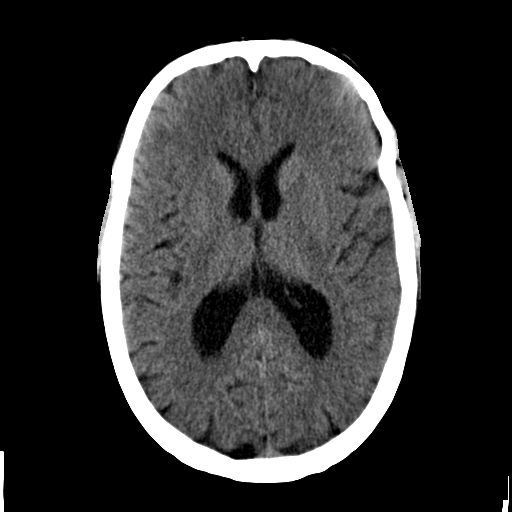
[im 17/33  bone]
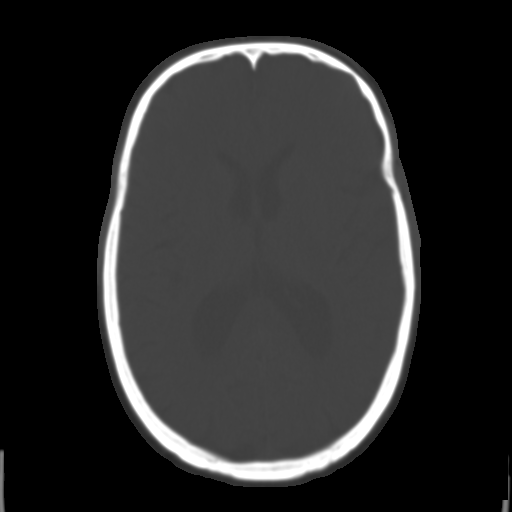
[im 19/33  brain]
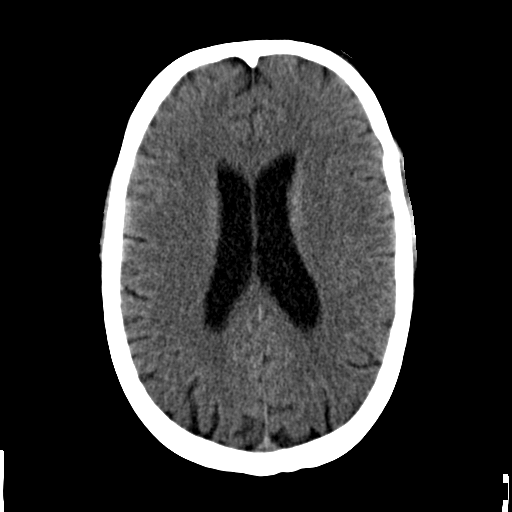
[im 21/33  brain]
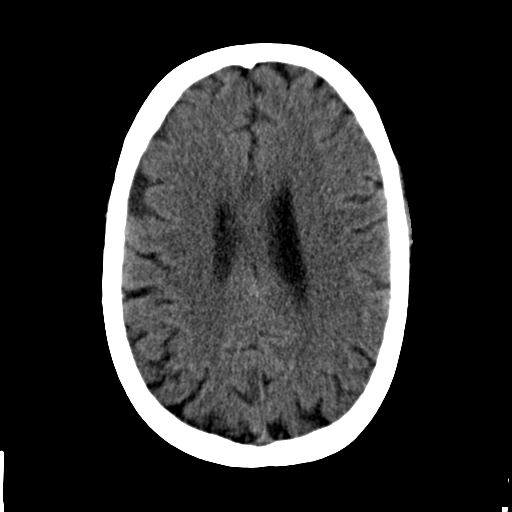
[im 24/33  brain]
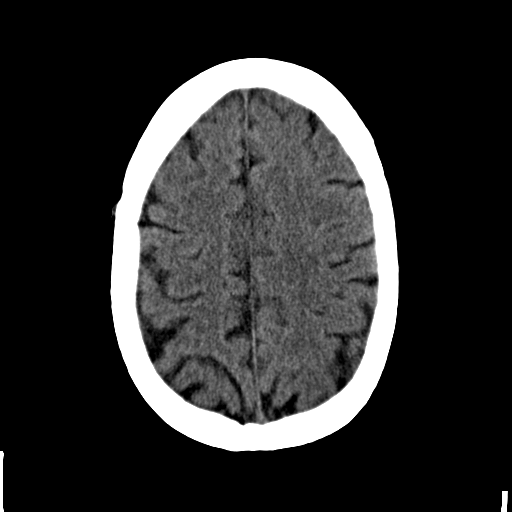
[im 25/33  brain]
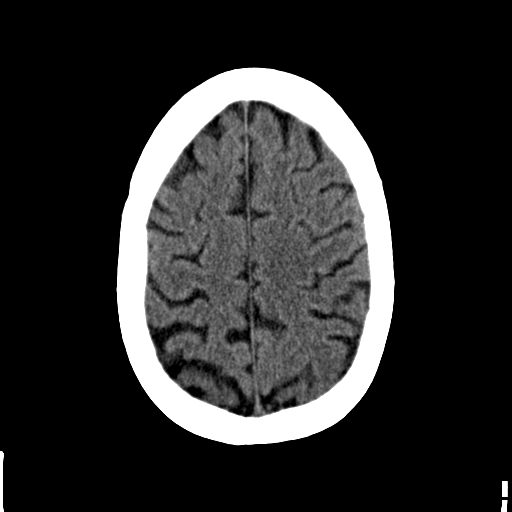
[im 25/33  bone]
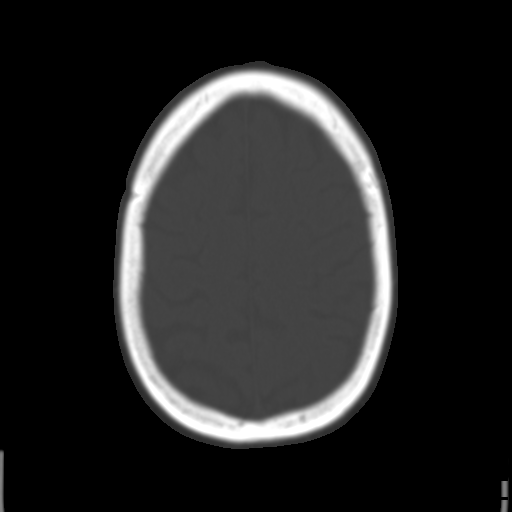
[im 27/33  brain]
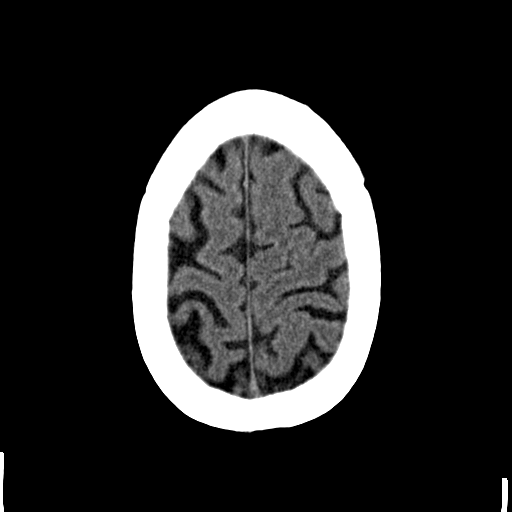
[im 29/33  brain]
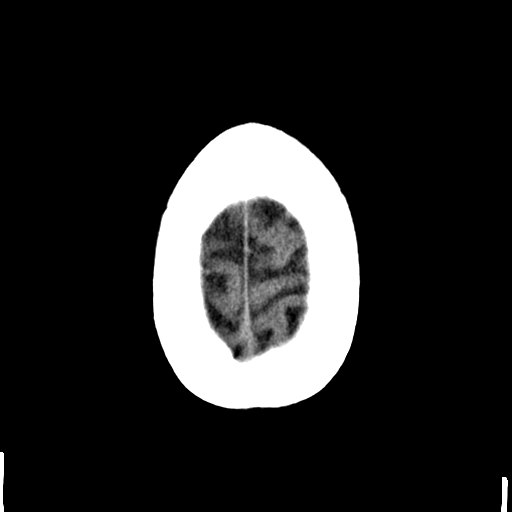
[im 31/33  brain]
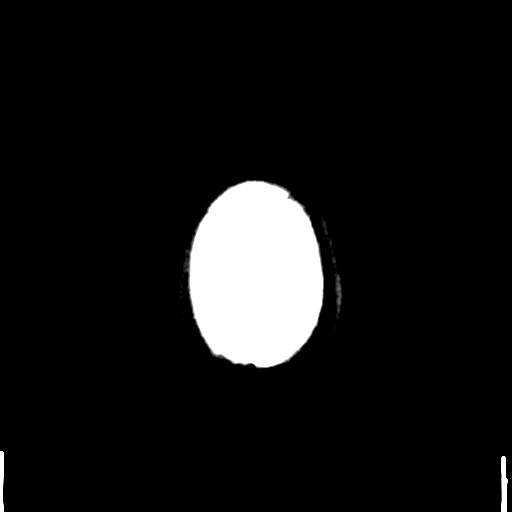

[16 of 30 positions shown; findings below may reference images not displayed]

FINDINGS: There is no evidence of mass effect, midline shift, or extra-axial fluid
collections.  There is no evidence of a space-occupying lesion or
intracranial hemorrhage. There is no evidence of a cortical-based area of
acute infarction.

The ventricles and sulci are appropriate for the patient's age. The basal
cisterns are patent.

Visualized portions of the orbits are unremarkable. There is right maxillary
sinus mucosal thickening. There is evidence of prior turbinate resection.

The osseous structures are unremarkable.
IMPRESSION: No acute intracranial process.

[REDACTED]

## 2014-04-26 IMAGING — CR DG CHEST 2V
1 series · 4 of 4 positions shown · non-contrast
Comparison: none

REASON FOR EXAM: wheezing
COMMENTS:

PROCEDURE:     DXR - DXR CHEST PA (OR AP) AND LATERAL  - December 06, 2012  [DATE]
RESULT:     Comparison: 11/19/2012

[Series 1: ap · 0.17mm/px · 4 of 4 slices shown]
[im 1/4]
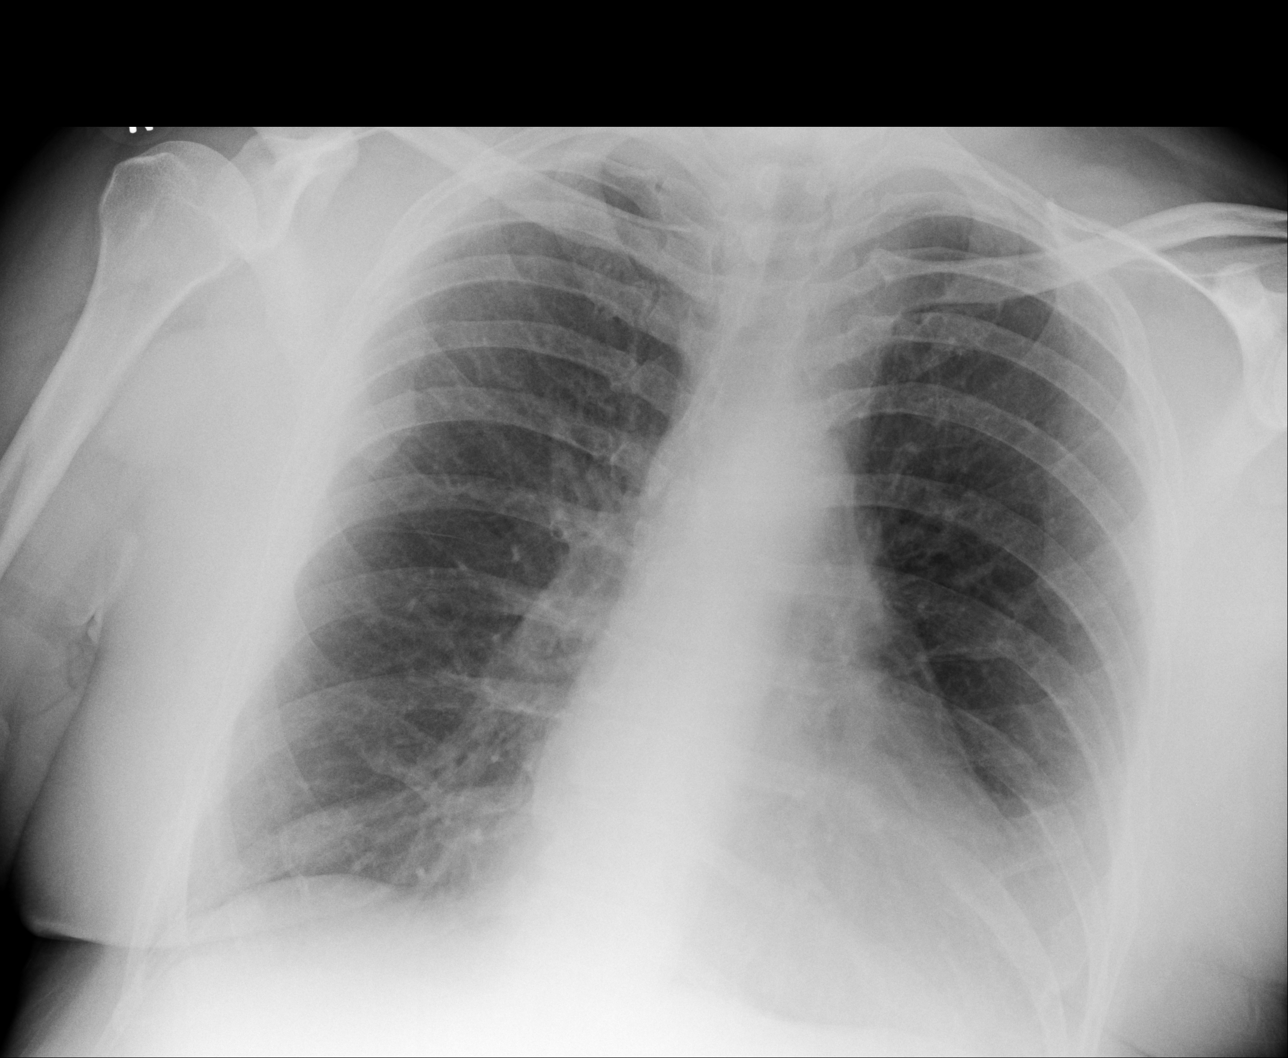
[im 2/4]
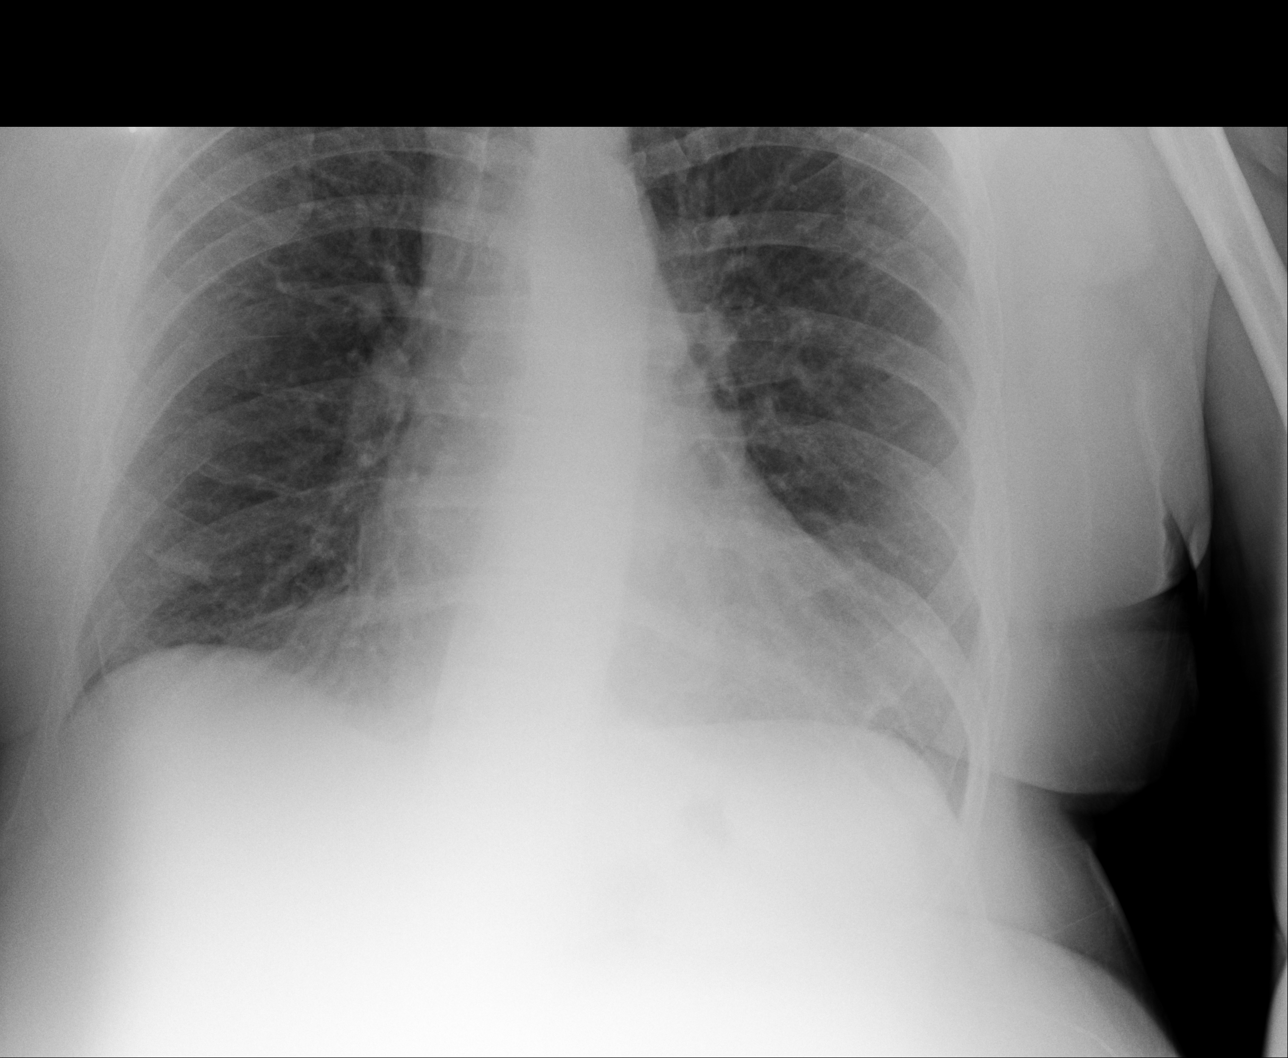
[im 3/4]
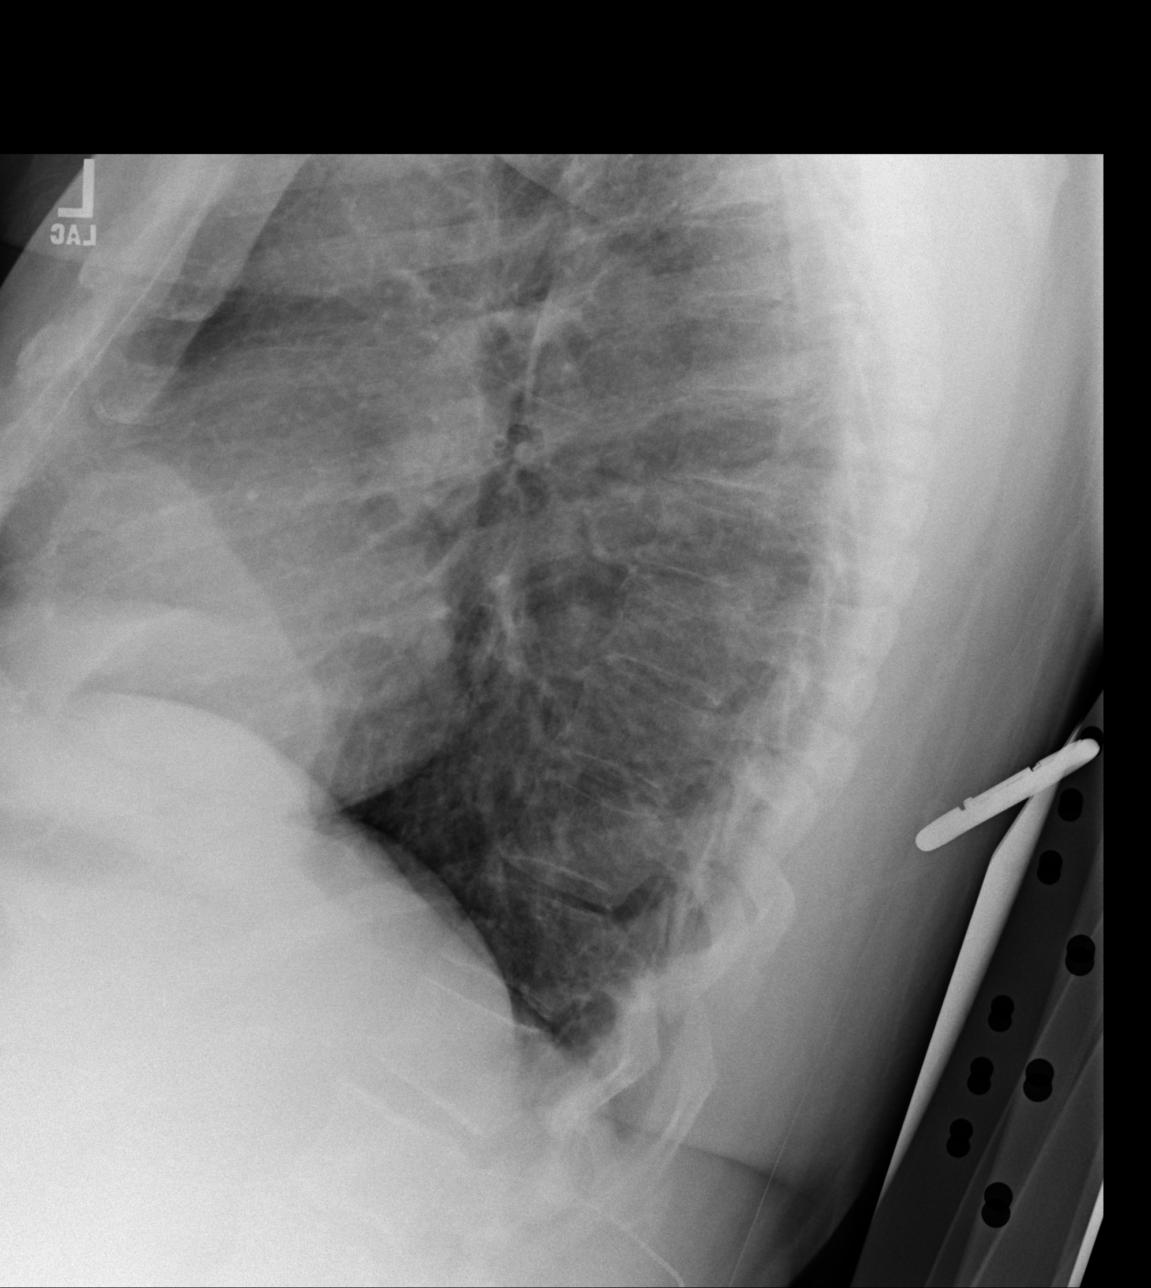
[im 4/4]
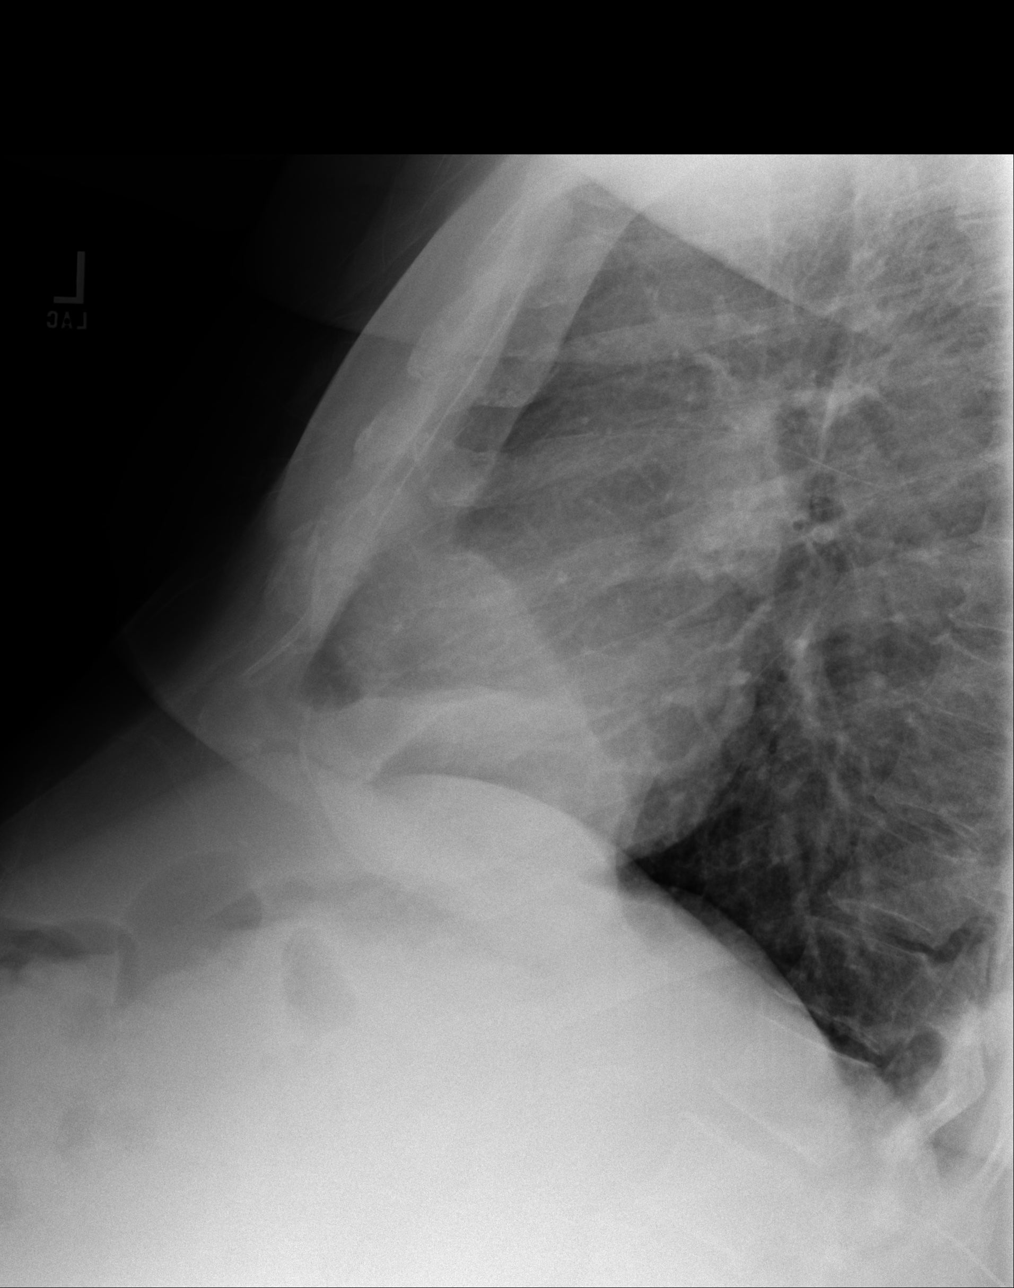

[4 of 4 positions shown; findings below may reference images not displayed]

FINDINGS: The heart and mediastinum are stable. There are bilateral interstitial
opacities which are nonspecific. No focal pulmonary opacities. The
previously described left lobe opacities have resolved.
IMPRESSION: Mild interstitial opacities are nonspecific. Correlate for acute or chronic
interstitial lung disease.

[REDACTED]

## 2014-07-10 ENCOUNTER — Encounter (HOSPITAL_COMMUNITY): Payer: Self-pay | Admitting: Neurosurgery

## 2014-10-17 NOTE — Consult Note (Signed)
PATIENT NAME:  [name], [name] MR#:  [id number] DATE OF BIRTH:  [birth year]  DATE OF CONSULTATION:  11/20/2012  REFERRING PHYSICIAN:  Hilda LiasVivek Sainani, MD CONSULTING PHYSICIAN:  Ardeen FillersUzma S. Garnetta BuddyFaheem, MD  REASON FOR CONSULTATION: Acute altered mental status.   HISTORY OF PRESENT ILLNESS: The patient is a 59 year old married Caucasian male who has history of recent admission to the hospital, presented again because of agitation after his wife called the EMS as he is not himself. According to the history, the patient went out and bought an 18 pack of beer as well as took 4 tablets of Ativan. She reported that the patient has told her that he wanted to kill himself. The patient was transported to the ED. He was agitated and incoherent at the time of presentation. He required further sedation to control his behavior as he was pulling his IV and was trying to get out of the bed. His evaluation suggests urinary tract infection and mild hypokalemia with evidence of metabolic and respiratory alkalosis. His urine drug screen was negative. The patient was admitted to the hospital for further evaluation and treatment and placed on involuntary commitment.   During my interview, the patient remains agitated and reported that he drove himself to the local store as he was feeling depressed and sad and he bought the alcohol. He reported that he was having back and neck pain and the pain medications were not helping. The patient reported that he wants to sign out AMA right now as he has done this in the past. He has history of recurrent admissions and was placed on suicide precautions in the past as well. He stated that he takes  pain medications, but they do not help. He reported that he does not want anybody else to touch him and only his wife to help him for everything. The patient reported that he does not feel depressed. He reported that he does not have any thoughts to hurt himself. He does not remember saying that he was  going to hurt himself.  However, his wife reinforced that he told "Bonita QuinLinda".  Collateral information obtained from his wife and she stated that she does not feel comfortable taking him back home at this time as she feels that he will go out and drink again. She stated that he will lose control whenever he will start drinking as he asks her as he wants to drink more. She stated that he is in pain most of the time and the pain medications are not helping him. She stated that he has been compliant with most of his medications.   PAST PSYCHIATRIC HISTORY: The patient has history of pain and he has declined that he has any history of depression or anxiety. He does not take any medications for depression. He was prescribed Ativan for anxiety in the past.   PAST MEDICAL HISTORY: The patient has history of recurrent presentations to the hospital and was recently admitted for acute metabolic encephalopathy.  He has also developed E. coli urinary tract infection, dehydration hyponatremia. He has history of spastic paraplegia with left above-the-knee amputation. History of peripheral neuropathy, type diabetes, prior history of decubitus ulcer, chronic diastolic dysfunction, COPD O2 dependent and uses CPAP at home. He has history of systemic hypertension, glaucoma and left eye blindness.   PAST SURGICAL HISTORY: Left above-the-knee amputation and baclofen pump placement in the lower abdomen.   SOCIAL HISTORY: The patient currently lives with his wife. He is on disability and usually uses a  wheelchair for ambulation.   ALLERGIES: No known drug allergies.   REVIEW OF SYSTEMS:  The patient did not participate much in the same as he appears somewhat hyper and was agitated at this time.   VITAL SIGNS: Temperature 97.5, pulse 98, respirations 16, blood pressure 140/88.  LABORATORY DATA:  Glucose 89, BUN 8, creatinine 0.67, sodium 139, potassium 3.4, chloride 107, bicarbonate 22, anion gap 10, osmolality 275, magnesium  1.3, ammonia 75. Blood alcohol level less than 3. Protein 7.2, albumin 3.3, bilirubin 0.3, alkaline phosphatase 135, AST 60, ALT 51. TSH 0.56. Urine drug screen was negative. WBC 12, RBC 5.14, hemoglobin 15.6, hematocrit 46.3, platelet count 314, MCV 90, MCH 13.3, MCHC 33.7, RDW 15.7.   MENTAL STATUS EXAMINATION: The patient is a large built male who was lying in the bed. He maintained poor eye contact. His speech was somewhat slurred initially but later it became cleared. His mood was anxious. Affect was blunted. Thought process was tangential. Thought content was nondelusional. He currently denied having any suicidal or homicidal ideations or plans. He demonstrated poor insight about his use of alcohol at this time. No perceptual disturbances were noted. He denied having any impulsive behavior,  but has been using alcohol and minimizing his use of alcohol at this time.   DIAGNOSTIC IMPRESSION:  AXIS I: 1.  Alcohol dependence.  2.  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  AXIS II: None.  AXIS III: Please review the medical history.   TREATMENT PLAN:  I discussed his condition at length with his wife and she is concerned about his consistent use of alcohol as well as chronic pain medications.   She wants the patient to get some help. The patient does not meet the criteria to be admitted to the inpatient behavioral health unit due to his chronic medical conditions. I will release him from involuntary commitment at this time. The patient currently denied having any suicidal or homicidal ideations or plans. I will start him on lorazepam 1 mg p.o. q. 8 hours for alcohol withdrawal. He can be referred to rehabilitation to help with his alcohol use and then can be discharged once he becomes clinically stable. The patient does not want to take any antidepressant medication at this time. I have also discussed with his wife at length that she needs to help him and not allow the patient to go out and buy alcohol  as it is not good for this patient. She demonstrated understanding. The patient will be not be prescribed any antidepressant medications at this time. The patient can be discharged once he becomes clinically stable. Thank you for allowing me to participate in the care of this patient.   ____________________________ Ardeen Fillers. Garnetta Buddy, MD usf:sb D: 11/20/2012 16:05:36 ET T: 11/20/2012 16:29:42 ET JOB#: [id number]  cc: Ardeen Fillers. Garnetta Buddy, MD, <Dictator> Rhunette Croft MD ELECTRONICALLY SIGNED 11/22/2012 14:26

## 2014-10-17 NOTE — Discharge Summary (Signed)
PATIENT NAME:  [name], [name] MR#:  [id number] DATE OF BIRTH:  10-17-1955  DATE OF ADMISSION:  10/13/2012  Left against medical advice on 10/14/2012 before rounding physician could see him.  ADMITTING DIAGNOSES: 1.  Dyspnea, likely multifactorial.  2.  Chronic obstructive pulmonary disease.  3.  Hyponatremia. 4.  Hypokalemia.  5.  Hypomagnesemia.  6.  Tobacco abuse.  7.  Leukocytosis.   DISCHARGE DIAGNOSES:  1.  Dyspnea, likely multifactorial.  2.  Chronic obstructive pulmonary disease.  3.  Hyponatremia. 4.  Hypokalemia.  5.  Hypomagnesemia.  6.  Tobacco abuse.  7.  Leukocytosis.   CONSULTANTS:  None.   RADIOLOGIC STUDIES: Chest x-ray portable, single view, 10/13/2012 showed no acute cardiopulmonary disease, stable appearance. Abdomen, flat and erect, 10/13/2012 revealed no definite evidence of bowel obstruction or perforation. Very limited study due to patient's body habitus.   HISTORY:  The patient is a 59 year old male with past medical history significant for history of COPD as well as ongoing tobacco abuse, diabetes, hypertension, as well as CHF, who presented to the hospital with complaints of shortness of breath. Please refer to Dr. Arlys John admission on 10/13/2012. On arrival to the hospital, patient was complaining of shortness of breath for the past one month. He was apparently seen at [facility] and spent 8 days over there for treatment of what sounded like COPD. He left [facility] because he was tired. He left AMA.  They did nothing for him over there, but whatever they did, they did not address his shortness of breath. At [facility], he was told that he has just a few weeks to live. He got very upset. However, apparently, according to medical records, the patient was hypoxic, with his oxygen level of 90% on 2 liters of oxygen through nasal cannula. He was placed on 4 liters of oxygen through nasal cannula, and his sats improved. On arrival to the hospital,  patient's temperature was 99.1, pulse was 84, respiratory rate was 24, blood pressure 108/61, saturation was 95% on oxygen therapy. Physical exam revealed diminished breath sounds bilaterally, but no rales, rhonchi or wheezing. His abdomen was protuberant, moderately firm, but no significant tenderness was noted. No hepatosplenomegaly or masses were noted.   LAB DATA: Done on April 19 on admission to the Emergency Room revealed marked abnormalities. His sodium level was low at 115, patient's glucose 110, potassium 2.4, bicarbonate level was elevated at 33, magnesium level was also low at 1.1. The patient was given IV fluids. However, patient's sodium level improved only minimally to 117 and potassium level improved also just minimally to 2.5. The patient's cardiac enzymes x 2 were unremarkable. The patient's white blood cell count was mildly elevated to 11.7, hemoglobin was 15.6, platelet count 249. D-dimer was slightly elevated at 0.61.   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 No acute ST-T changes were noted. Chest x-ray was unremarkable.   HOSPITAL COURSE:  The patient was admitted to the hospital with diagnosis of dyspnea, which was felt to be multifactorial, likely due to mild COPD exacerbation as well as possibly abdominal distention due to constipation, and difficulty breathing due to this. He was continued on oxygen, and an enema was ordered for constipation. The patient got progressively more irritated and decided to leave against medical advice on 10/14/2012 before rounding  physician was able to see him.  On admission to the floor, patient's vital signs were stable, temperature 98.2, pulse was 92, respiratory rate was 18, blood pressure 119/71, saturation was 92% on 2 liters of oxygen by nasal cannula at rest.   TIME SPENT:  40 minutes.      ____________________________ Katharina Caperima Bertina Guthridge, MD rv:mr D: 10/28/2012 17:42:00 ET T: 10/28/2012 19:16:53 ET JOB#: [id number]  cc: Lyndon CodeFozia M. Khan, MD Katharina Caperima Kerry-Anne Mezo, MD, <Dictator>   Jaimi Belle MD ELECTRONICALLY SIGNED 11/03/2012 8:58

## 2014-10-17 NOTE — Consult Note (Signed)
PATIENT NAME:  [name] DATE OF BIRTH:  Jan 11, 1956  DATE OF CONSULTATION:  10/20/2012  REFERRING PHYSICIAN: Starleen Arms, MD  CONSULTING PHYSICIAN:  Ardeen Fillers. Garnetta Buddy, MD  REASON FOR CONSULTATION: Evaluate for depression and risk for suicide.   HISTORY OF PRESENT ILLNESS: The patient is a 59 year old, married, morbidly obese male who was admitted due to multiple medical problems and metabolic encephalopathy, who has recently left [facility] AMA, who presented here due to feeling very lethargic and was started on BiPAP in the Emergency Room. The patient presented to the ED as he was very agitated and was reluctant to put on the BiPAP. He was started on 3 liters nasal cannula, and his sats were 96%. Initially, the patient was unable to open his eyes and was falling back asleep. He initially received a dose of Narcan, to which he responded, and he started becoming more responsive. His blood pressure stabilized. The patient admitted for encephalopathy which was multifactorial. The patient has history of chronic bedsores, and he has been taking multiple pain medications. Yesterday, when he was not getting the pain medications as prescribed, as he responded well to the Narcan, he became agitated, and he reported to his treatment team that he should rather die. The patient was placed on suicide precautions, and psychiatric consult was placed.   During my evaluation, the patient was lying in the bed, and his wife was present at the bedside. He reported that his legs and his body feel miserable. He stated that his back is miserable and it hurts, and his pain is bad. He reported that he needs a hot shower and is waiting on the PT to help for the same. The patient reported that he made a stupid comment yesterday, and he did not mean anything about the same. He reported that he does not feel depressed, but he is only in a lot of pain, and he is not getting enough pain medications in this  hospital. His wife also agreed with the same. However, he mentioned that he is feeling more clear-headed and his mind is more clear and his thoughts are getting clear. He stated that he wants to go back home and start taking the same pain medications. He reported that he was getting pain medications from his primary care physician, Dr. Beverely Risen. He has been following with her for several years, and she has been treating him for all the general medical conditions. He reported that he does not abuse his medications. He reported that he is not having any suicidal or homicidal ideation or plans at this time. He has no psychiatric history.   PAST PSYCHIATRIC HISTORY: The patient reported that he has never taken any medication for depression. He reported that he was given Cymbalta for pain management, but it was stopped since he was admitted to the hospital. He does not know the reason for stopping the Cymbalta. He denied any history of psychiatric hospitalization.   PAST MEDICAL HISTORY:  1. COPD, on 2 liters nasal cannula and CPAP at home.  2. Hypertension.  3. Diabetes.  4. CHF.  5. History of spastic paralysis for the past 4 years, wheelchair bound and status post baclofen pump placement.  6. Above-knee amputation in January 2014 on left leg.  7. Baclofen pump placement in his left lower abdomen.  8. History of glaucoma.   SOCIAL HISTORY: The patient currently lives at home with his wife. He is disabled and is wheelchair bound.  REVIEW OF SYSTEMS: The patient did not participate much in the same.   CURRENT HOME MEDICATIONS:  1. Zolpidem 10 mg daily. 2. Ventolin HFA 2 puffs 4 times daily.  3. OxyContin 30 mg every 12 hours.  4. Metformin 500 mg p.o. b.i.d.  5. Lorazepam 2 mg b.i.d.  6. Hydrochlorothiazide/lisinopril 25/20 mg daily. 7. Glimepiride 2 mg 1 tablets b.i.d. 8. Gabapentin 300 mg 1 to 2 capsules daily. 9. Advair Diskus 250/50 mg b.i.d.  10. Acetaminophen/oxycodone 325/10 mg 4  times daily.   ALLERGIES: No known drug allergies.   VITAL SIGNS: Temperature 97.8, pulse 62, respirations 20, blood pressure 103/39.   LABORATORY DATA: Glucose 95, BUN 11, creatinine 0.82, sodium 133, potassium 3.2, chloride 93, phosphorus 8.9, magnesium 1.7. CK 674, CK-MB 8.2. WBC 10.9.   MENTAL STATUS EXAMINATION: The patient is a large-built male who was lying in the bed. He maintained fair eye contact. His mood was fine. Affect was blunted. Thought process was logical and goal-directed. Thought content was non-delusional. He currently denied having any suicidal or homicidal ideations or plans. He demonstrated fair insight and judgment regarding his illness. No perceptual disturbances are noted. No impulsive behavior was noted at this time.   DIAGNOSTIC IMPRESSION:  AXIS I: Depressive disorder, not otherwise specified.  AXIS II: None.  AXIS III: Chronic obstructive pulmonary disease, hypertension, diabetes, congestive heart failure, history of spastic paralysis, history of glaucoma, above-knee amputation, baclofen pump placement.   TREATMENT PLAN:  1. I discussed with the patient at length about the suicidal ideation. The patient currently denied that he is having any suicidal thoughts or plans. He reports that it was a stupid statement. He contracted for safety. I will discontinue the suicide precautions at this time.  2. Also discussed with the patient about starting an antidepressant, that he might need some medication to help with his depression, but he reported that he is not feeling depressed at this time. He also discussed with his wife, and she also agreed that the patient is not having any depressive symptoms. He is not interested in taking any antidepressants at this time. Will not start him on any medications. The patient can be discharged once he is clinically stable from the hospital.   Thank you for allowing me to participate in the care of this patient.    ____________________________ Ardeen FillersUzma S. Garnetta BuddyFaheem, MD usf:OSi D: 10/20/2012 12:22:52 ET T: 10/20/2012 12:45:27 ET JOB#: [id number]  cc: Ardeen FillersUzma S. Garnetta BuddyFaheem, MD, <Dictator> Rhunette CroftUZMA S Mikah Poss MD ELECTRONICALLY SIGNED 10/21/2012 11:58

## 2014-10-17 NOTE — H&P (Signed)
PATIENT NAME:  [name], [name] MR#:  [id number] DATE OF BIRTH:  [birth year]  DATE OF ADMISSION:  10/18/2012  PRIMARY CARE PHYSICIAN: Lyndon CodeFozia M. Khan, MD  CHIEF COMPLAINT: Altered mental status, confusion.   HISTORY OF PRESENT ILLNESS: History is obtained from the patient's wife, who was in the Emergency Room, and old records. The patient is lethargic and unable to provide any history or review of systems. The patient is a 59 year old morbidly obese Caucasian gentleman with multiple medical problems, who was [facility], left AMA since he was very upset after he was told he had only a few weeks to live. He came to the Emergency Room on 19th of April, and at that time, he came in with some shortness of breath. He was admitted; however, he decided he was very uncomfortable in the bed and reluctant to give any history, got upset and went home. Today, he was brought to the Emergency Room by his wife from home because of encephalopathy, confusion and not being himself. The patient was very lethargic, was started on BiPAP in the Emergency Room. He was very agitated and reluctant to put the BiPAP on. Currently, he is on 3 liters nasal cannula, and sats are 96%. Several attempts were made to try to get ABG; however, it was not able to do so. The patient opens eyes momentarily and falls back to sleep. He also received a dose of Narcan. Blood pressure is stable. He is being admitted for encephalopathy, etiology unclear, multifactorial, with possible rule-out infection. He has chronic bedsores and pain medication-related as well.   PAST SURGICAL HISTORY:  1. Baclofen pump placement in his left lower abdomen.   2. Above-knee amputation in January 2014 on left leg.   PAST MEDICAL HISTORY:  1. COPD, on 2 liters nasal cannula oxygen and CPAP at home.  2. Hypertension.  3. Diabetes.  4. CHF.  5. History of spastic paralysis for the past 4 years, wheelchair bound, status post baclofen pump  placement.  6. History of glaucoma.   SOCIAL HISTORY: Tobacco abuse. The patient lives at home with his wife. He is disabled and has a wheelchair.   REVIEW OF SYSTEMS: Unobtainable since the patient is quite obtunded.   MEDICATIONS:  1. Zolpidem 10 mg daily.  2. Ventolin HFA 2 puffs 4 times a day.  3. OxyContin 30 mg every 12 hours.  4. Metformin 500 b.i.d.  5. Lorazepam 2 mg 2 times a day.  6. Hydrochlorothiazide/lisinopril 25/20 one tablet daily.  7. Glimepiride 2 mg 1 tablet b.i.d.  8. Gabapentin 300 mg 1 to 2 capsules daily.  9. Advair Diskus 250/50 one puff b.i.d.  10. Acetaminophen/oxycodone 325/10 one tablet 4 times a day.   ALLERGIES: No known drug allergies.   PHYSICAL EXAMINATION:  VITAL SIGNS: The patient is morbidly obese, about 245 pounds. He is afebrile, pulse is 78, respirations 8 per minute. Initially, blood pressure was 101/60. Sats are 96% on 3 liters nasal cannula.  GENERAL: Exam limited secondary to patient is lethargic.  HEENT: Atraumatic, normocephalic. Pupils are sluggishly reacting to light. No conjunctival injected. Oral mucosa is moist.  NECK: Supple.  RESPIRATORY: Clear to auscultation bilaterally. Decreased breath sounds at the bases. No crackles heard.  CARDIOVASCULAR: Both the heart sounds are normal. No murmur heard. PMI not lateralized.  EXTREMITIES: Left above-knee amputation. Right lower extremity: Feeble pedal pulses.  SKIN: Warm and dry. The patient does have some chronic buttock ulcers according to the patient's wife. I  was not able to move the patient around given his obesity, and the patient is not comfortable at this time.  NEUROLOGIC: Exam again limited secondary to encephalopathy. The patient moves all his extremities well. He will awaken on verbal commands, falls back asleep very easily. His reflexes are 1+ in both upper and lower extremities.   LABORATORY DATA: UA negative for UTI. Glucose is 99, BUN 14, creatinine is 0.83, sodium is 119,  potassium is 2.4, chloride is 78, bicarbonate is 28, calcium [place] 7.5, bilirubin is 0.7, alkaline phosphatase is 184, SGOT is 111, total protein is 6.7, albumin is 2.7. B-type natriuretic peptide is 93. Magnesium is 1.5. Chest x-ray consistent with COPD. EKG: Normal sinus rhythm with left fascicular block. White count is 16.3, H and H is 14.1 and 40.4, platelet count is 338.   ASSESSMENT: A 59 year old patient with multiple medical problems, including chronic obstructive pulmonary disease with 2 liters of oxygen at home, hypertension, diabetes, morbid obesity, who comes to the Emergency Room with:   1. Acute encephalopathy/altered mental status. Appears multifactorial, most likely could be with poor p.o. intake and the patient taking his pain medications, which is increasing dose of combination of baclofen and oxycodone. The patient received some Narcan in the Emergency Room. Will try to give 1 more dose to see if he awakens. He responds to verbal commands, opens his eyes and falls back asleep quickly. CT of the head is negative. Will admit the patient to telemetry floor. Keep him n.p.o. at this time. Continue IV fluids for hydration. Continue monitoring his neuro status q.4 hours. Consider neurology consultation if needed.  2. Electrolyte abnormality with hyponatremia, hypokalemia, hypomagnesemia and hypochloremia, appears due to poor p.o. intake per wife, who is in the Emergency Room. According to her, the patient has not been eating well. He has been drinking fluids; however, not taking proper nutrition. Will start the patient on IV normal saline along with potassium and magnesium replacement. Check phosphorus and follow up electrolytes closely. If the patient's mentation does not improve and his sodium does not improve as well, consider a nephrology consultation for possible further workup.  3. Chronic respiratory failure with chronic obstructive pulmonary disease, seems to be stable at this time. Will  continue his nebulizer and oxygen. Will try to attempt to get ABG to make sure he is not retaining CO2. His serum bicarbonate appears normal.  4. Elevated white count, again could be reactive; however, the patient does have some bedsores per wife. I was not able to do a detailed exam of those bedsores given the patient's body habitus, and at present, he is so lethargic, I am not able to move him around. Will give empirically IV Rocephin, and once able, try to examine those ulcers and appropriate dressing instructions. Consider wound management if needed.  5. Type 2 diabetes. Will continue sliding scale insulin at this time. The patient is n.p.o. 6. Deep vein thrombosis prophylaxis with heparin.   DISCHARGE PLANNING: The patient's wife has been having a difficult time managing him alone by herself at home. Will have care management look into options for discharge planning for patient.   CODE STATUS: This was discussed at length with wife, who is next of kin for the patient. She is requesting DNR.   CRITICAL TIME SPENT: 60 minutes.  ____________________________ Wylie Hail Allena Katz, MD sap:OSi D: 10/18/2012 14:45:11 ET T: 10/18/2012 15:33:07 ET JOB#: [id number]  cc: Kaci Dillie A. Allena Katz, MD, <Dictator> Lyndon Code, MD Jearl Klinefelter Ammie Dalton MD  ELECTRONICALLY SIGNED 10/26/2012 6:56

## 2014-10-17 NOTE — Discharge Summary (Signed)
PATIENT NAME:  [name], [name] MR#:  [id number] DATE OF BIRTH:  1955/12/05  DATE OF ADMISSION:  11/20/2012 DATE OF DISCHARGE:  11/21/2012  For a detailed note, please take a look at the history and physical done on admission by Dr. Marlaine Hind.   DIAGNOSES AT DISCHARGE:  Is as follows:  Altered mental status and encephalopathy secondary to alcohol binge-drinking episode and polypharmacy.  Possible urinary tract infection.  Diabetes, hypertension, chronic pain syndrome, diabetic neuropathy.   DIET:  The patient is being discharged on a low-sodium, low-fat, carb-controlled diet.   ACTIVITY:  As tolerated.   FOLLOW-UP:  With patient's primary care physician in [place].   DISCHARGE MEDICATIONS:  Metformin 500 mg twice daily, Advair 250 1 puff twice daily, OxyContin 30 mg twice daily, lorazepam 2 mg 1 tab twice daily.  Tylenol with oxycodone 10/325 1 tab 4 times daily as needed, glimepiride 2 mg twice daily, Ambien 10 mg at bedtime, hydrochlorothiazide/lisinopril 25/20 1 tab daily.  Albuterol inhaler 2 puffs 4 times daily as needed, Lyrica 75 mg twice daily, Victoza 0.6 mL subQ daily.   PERTINENT STUDIES DONE DURING THE HOSPITAL COURSE:  Are as follows:  CT scan of the head done without contrast on admission showing no acute intracranial abnormality.  A chest x-ray done on admission showing left basal opacities could be secondary to atelectasis.  Blood culture is essentially negative.  Urine culture presently pending.   BRIEF HOSPITAL COURSE:  This is a 59 year old male with medical problems as mentioned above, presented to the hospital due to altered mental status and confusion and also periods of agitation.  1.  Acute delirium/metabolic encephalopathy.  This was likely the cause of patient's mental status change and it was secondary to probably alcoholic binge drink, complicated with polypharmacy including the combination of pain meds and benzodiazepines.  The patient had a CT of the head on  admission which showed no acute intracranial process.  He also had a possible abnormal urinalysis consistent with a urinary tract infection, therefore was started on IV antibiotics for a urinary tract infection.  He also had a mildly elevated ammonia for which he was started on lactulose, although I do not think he had any evidence of clinically hepatic encephalopathy.  Overnight, patient's mental status has significantly improved.  He is now back down to baseline.  At one point the patient did have suicidal ideations, therefore the patient was placed on involuntarily commitment in the ER.  A psychiatric consult was obtained.  The patient was seen by them and his involuntary commitment was lifted and he did not require any inpatient psychiatric treatment.  Since the patient's mental status is now back down to baseline he is currently being discharged home.  2.  Depression with suicidal ideations.  Again, when the patient was admitted there was some concern about a drug overdose and from polypharmacy, although his urine drug screen was essentially negative.  The patient was placed on involuntarily commitment by the Emergency Room physician.  A psychiatric consult was obtained.  The patient was seen by Dr. Garnetta Buddy who did not think that the patient had suicidal ideations and his involuntary commitment papers were lifted and he was cleared to go home from the psychiatric standpoint.  The patient was given phone numbers for outpatient substance abuse programs which he may choose to if he wants to.  3.  Diabetes.  The patient had no evidence of any hypoglycemic episodes.  He will continue his glimepiride, metformin upon discharge.  4.  Hypertension.  The patient remained hemodynamically stable.  He will continue his hydrochlorothiazide and lisinopril upon discharge.  5.  Chronic pain.  The patient was maintained on his OxyContin and oxycodone.  Initially they were held due to his altered mental status, but they were  resumed shortly after admission and he has tolerated them well without any further changes in mental status.  Any titrations to his pain meds can be done as an outpatient by his primary care physician.  6.  Diabetic neuropathy.  The patient was maintained on his Lyrica.  He will resume that upon discharge.  7.  Urinary tract infection.  His urine cultures are still pending.  His urinalysis was mildly positive for a urinary tract infection.  He was initially empirically started on Zosyn, eventually switched over to ceftriaxone and he is being discharged on by mouth Ceftin for about five days.   The patient is a FULL CODE.   DISPOSITION:  He is being discharged home.   Time spent on discharge is 40 minutes.    ____________________________ Rolly PancakeVivek J. Cherlynn KaiserSainani, MD vjs:ea D: 11/21/2012 16:25:17 ET T: 11/22/2012 00:43:53 ET JOB#: [id number]  cc: Rolly PancakeVivek J. Cherlynn KaiserSainani, MD, <Dictator> Houston SirenVIVEK J Danny Yackley MD ELECTRONICALLY SIGNED 12/02/2012 20:30

## 2014-10-17 NOTE — H&P (Signed)
PATIENT NAME:  [name], [name] MR#:  [id number] DATE OF BIRTH:  [birth year]  DATE OF ADMISSION:  10/13/2012  PRIMARY CARE PHYSICIAN:  Dr. Beverely Risen.   HISTORY OF PRESENT ILLNESS:  The patient is a 59 year old Caucasian male with past medical history significant for history of COPD, history of ongoing tobacco abuse, diabetes, hypertension, as well as CHF, who presents to the hospital with complaints of shortness of breath.  According to patient, he has been having problems with shortness of breath for the past one month.  He was seen at [facility] and spent 8 days over there for treatment of what sounded like COPD.  He apparently left [facility] because he was tired.  They did nothing over there.  Whatever they did, they did not address his shortness of breath.  At [facility] he apparently was told that he has only a few weeks to live.  He is not able to provide much history, and in fact he became progressively angry and upset here in the hospital, very difficult to examine him, however according to medical records, the patient was taken by EMS from home where his O2 sats were found to be 90% on 2 liters of oxygen through nasal cannula.  He was placed on 4 liters of oxygen nasal cannula and his sats improved.  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  Overall he is very uncomfortable here and again not able to provide much history because he is being upset and reluctant to provide a history.   PAST MEDICAL HISTORY:  Significant for history of COPD.  According to medical records he is apparently on oxygen at home at 2 liters of oxygen through nasal cannula.  It is unclear if he is using this 24/7 or just at nighttime.  He also has a history of hypertension, diabetes, CHF, history of spastic paralysis for the past four years, wheelchair-bound, status post baclofen pump placement, history of glaucoma.  Past medical history is also significant  for a history of tobacco abuse smoking dependence.  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    MEDICATIONS:  According to medical records, the patient is on Advair Diskus 250/50 1 puff twice a day, gabapentin 300 mg 3 times daily, lorazepam 2 mg twice daily, metformin 500 mg by mouth twice daily, OxyContin 30 mg by mouth twice daily and Percocet 10/325 mg 1 tablet twice a day.  It is unclear if this full medication list, however as patient's medication review is not reconciled yet, but he was on above-mentioned medications in January 2014.    FAMILY HISTORY:  Positive for throat cancer.   As mentioned above, infusion pump in left abdomen of Baclofen.    SOCIAL HISTORY:  The patient smokes 2 to 2-1/2 packs a day and is not interested in quitting, however when discussed with him for a long period of time he is willing to try nicotine inhaler.  No IV drug abuse or alcohol abuse.  Lives at home with his wife.   REVIEW OF SYSTEMS:  Is difficult to obtain, however patient admits of having some fatigue and weakness, some blurring of vision for glaucoma, however denies any ophthalmological drops.  He admits of having some postnasal drip and sinus drainage, however no sinus pains.  Elicits some cough as well as wheezing and whitish milkish thick phlegm production as  well as shortness of breath for the past one month.  Admits of intermittent nausea and vomiting as well as significant constipation.  He tells me that he does not remember exactly how long ago he had any other bowel movements.  Otherwise, it is difficult to get any other history.  Denies any fevers or chills, pains, weight loss or gain.   HEENT:  Denies any double vision, glaucoma.  Denies any cataracts.  Denies any tinnitus, allergies, epistaxis, sinus pain, dentures, difficulty swallowing.  RESPIRATORY:  Denies any hemoptysis, asthma.  Admits of COPD.  CARDIOVASCULAR:  Denies any chest pain or  arrhythmias, palpitations or syncope.  GASTROINTESTINAL:  Denies any abdominal pain, diarrhea, rectal bleeding, change in bowel habits.  GENITOURINARY:  Denies any dysuria, hematuria, or incontinence. ENDOCRINOLOGY:  Denies any polydipsia, thyroid problems.  PSYCHIATRY:  Denies anxiety, insomnia.  SKIN:  Denies any acne, rashes, lesions.  MUSCULOSKELETAL:  Denies arthritis, cramps.  NEUROLOGIC:  No numbness, epilepsy or tremor.   PHYSICAL EXAMINATION:   VITAL SIGNS:  On arrival to the hospital in the Emergency Room, patient's vital signs, temperature is 99.1, pulse was 84, respiration rate was 24, blood pressure 108/61, saturation was 95% on oxygen therapy.  GENERAL:  This is a well-developed, nourished obese Caucasian male in mild to moderate distress.  He is very uncomfortable, very grumpy and moving constantly on the stretcher.  He is complaining of pain under his right buttock.  He is not able to sit for a prolonged period of time.  HEENT:  His pupils are equal, reactive to light.  Extraocular movements intact.  No icterus or conjunctivitis.  Has normal hearing.  No pharyngeal erythema.  Mucosa is moist.  NECK:  No masses.  Supple, nontender.  Thyroid is not enlarged.  No adenopathy.  No JVD or carotid bruits bilaterally.  Full range of motion.  LUNGS:  Clear to auscultation all fields.  Diminished breath sounds, but no rales, rhonchi or wheezing.  No labored inspirations, increased effort, dullness to percussion, overt respiratory distress.  CARDIOVASCULAR:  S1, S2 appreciated.  No murmurs, gallops or rubs.  Rhythm is regular.  PMI not lateralized.  Chest is nontender to palpation.  1+ pedal pulses, are somewhat diminished, but palpable pedal pulses on the right.  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  No calf tenderness or cyanosis was noted on the right.  ABDOMEN:  Protuberant, moderately firm.  No significant tenderness.  No hepatosplenomegaly or masses were noted.   RECTAL:  Deferred.  The  patient does have baclofen pump in the left mid abdominal region.  MUSCLE STRENGTH:  Is difficult to obtain as patient himself is not able to move his right lower extremity.  He tries to move his right lower extremity with his hands.  Mild cyanosis was noted as well as somewhat cool right lower extremity, right foot.  No obvious kyphosis.  Gait is not tested.  SKIN:  Did not reveal any rashes, lesions, erythema, nodularity or induration.  It was warm and dry to palpation.  LYMPHATIC:  No adenopathy in the cervical region.  NEUROLOGICAL:  Cranial nerves was intact.  Sensory difficult to obtain or evaluate due to significant discomfort patient has been experiencing.  No dysarthria or aphasia.  The patient is alert, oriented to person, place, not cooperative.  No significant memory deficiency was noted.  The patient was noted to be agitated as well as depressed.   LABORATORY DATA:  Done on 10/13/2012 revealed a low sodium  level to 115 to 117 and potassium level is low at 2.4 to 2.5.  Magnesium level was also low at 111.  CK total was elevated to 272, MB fraction was 8.3, and troponin was normal at less than 0.02.  White blood cell count was mildly elevated at 11.5, hemoglobin was 15.6, platelet count 249.  The patient's EKG done here in the Emergency Room on 10/13/2012 showed normal sinus rhythm at 83 beats per minute, normal axis, possible left atrial enlargement according to EKG criteria.  Left anterior fascicular block, prolonged QTc to 486 ms and no acute ST-T changes were noted.   RADIOLOGIC STUDIES:  Chest x-ray is done today on 10/13/2012, portable single view x-ray showed no acute cardiopulmonary disease, stable appearance was noted.   ASSESSMENT AND PLAN: 1.  Dyspnea, likely multifactorial, related to multiple medical issues including chronic obstructive pulmonary disease, questionable cardiomyopathy, also fatigue and weakness as well as constipation.  We will admit patient for worsening dyspnea.   We will treat constipation.  We will get echocardiogram.  We will continue oxygen, keeping O2 sats at 88% to 92%.  We will get ABGs to rule out CO2 retention.  2.  Constipation.  We will initiate Colace as well as senna around-the-clock, and MiraLAX as needed.  Also we will give one enema.  We will follow output.  We will get also 2-way abdominal x-rays to rule out significant constipation obstruction.  3.  Chronic obstructive pulmonary disease, seems to be stable.  Continue home medications.  No obvious exacerbation noted.  Chest x-ray seemed to be okay.  Get sputum cultures and initiate antibiotic therapy if culture is abnormal.  4.  Hyponatremia.  We will continue IV fluids.  We will reassess sodium level in the morning.  5.  Hypokalemia, supplement IV as well as by mouth.   6.  Hypomagnesemia, supplement IV.  7.  Tobacco abuse.  We will initiate nicotine replacement therapy.  I discussed with patient as well as his wife nicotine replacement and its benefits and they are agreeable to try it to relieve these cravings.  8.  Leukocytosis.  We will get sputum cultures and we will not initiate antibiotic therapy at this time.  It is very likely that patient's leukocytosis is due to steroids the patient was receiving in the hospital.   TIME SPENT:  1 hour.    ____________________________ Katharina Caper, MD rv:ea D: 10/13/2012 22:13:40 ET T: 10/13/2012 23:32:13 ET JOB#: [id number]  cc: Katharina Caper, MD, <Dictator> Lyndon Code, MD Ebbie Sorenson MD ELECTRONICALLY SIGNED 10/28/2012 18:02

## 2014-10-17 NOTE — Discharge Summary (Signed)
PATIENT NAME:  [name], [name] MR#:  [id number] DATE OF BIRTH:  1955/08/07  DATE OF ADMISSION:  07/18/2012 DATE OF DISCHARGE:  07/19/2012  DISCHARGE DIAGNOSES: 1. Chronic osteomyelitis, left lower extremity.  2. Venous stasis disease with venous ulceration and cellulitis, left lower extremity.   PROCEDURE PERFORMED: A left above-knee amputation.   SECONDARY DIAGNOSES:  1. Chronic obstructive pulmonary disease.  2. Neuropathy.  3. Anxiety disorder.  4. Diabetes.   CONSULTATIONS: None.   INDICATIONS: The patient presented to the office with a long history of left lower extremity problems, ulcerations, venous stasis changes, chronic pain and had clinical evidence of osteomyelitis. The risks and benefits for amputation were discussed. The patient was actually the person requesting the amputation and after a lengthy conversation, it was agreed to proceed with left above-knee amputation.   HOSPITAL COURSE: On the day of admission, the patient underwent a left above-knee amputation without complication. He was taken to the recovery room and then to the floor where he did well. On postoperative day one he actually had much less pain than he had been experiencing. He was able to get himself in and out of his scooter chair without difficulties and was requesting discharge to home. He is certainly to fit for discharge. He will continue his home medications as noted. Diet is ADA 2000 calorie. Smoking cessation was encouraged.  ____________________________ Renford DillsGregory G. Schnier, MD ggs:jm D: 07/20/2012 09:49:28 ET T: 07/20/2012 10:46:24 ET JOB#: [id number]  cc: Renford DillsGregory G. Schnier, MD, <Dictator> Renford DillsGREGORY G SCHNIER MD ELECTRONICALLY SIGNED 08/13/2012 23:24

## 2014-10-17 NOTE — H&P (Signed)
PATIENT NAME:  [name], [name] MR#:  [id number] DATE OF BIRTH:  1955-07-28  DATE OF ADMISSION:  11/20/2012  PRIMARY CARE PHYSICIAN: Dr. Beverely Risen.   REFERRING PHYSICIAN: Suella Broad.   CHIEF COMPLAINT: Acute altered mental status.   HISTORY OF PRESENT ILLNESS: [name] is a 59 year old Caucasian male who has frequent admissions to this hospital with altered mental status. Last admission was last month, April 24th, and treated for metabolic encephalopathy and E. coli urinary tract infection and dehydration. The patient is a paraplegic. He has also left above-knee amputation, diabetes, COPD home oxygen dependent and using CPAP. The patient was transported by EMS to the hospital when his wife called that he is not himself. He is delirious and told her earlier that he took 4 tablets of Ativan as well as drank 14 beers. She stated that he told her that he wanted to kill himself. The patient was transported here. He is agitated and incoherent. Required further sedation to control his behavior as he is pulling his IV and trying to get out of bed. Evaluation here reveals suggestion of urinary tract infection and also mild hypokalemia and evidence of metabolic and respiratory alkalosis. His drug screen was negative. The patient was admitted to the hospital for further evaluation and treatment after having involuntary commitment papers signed.   REVIEW OF SYSTEMS: A 10-point system review is unobtainable due to the patient's delirium.   PAST MEDICAL HISTORY: Recent admission to this hospital last month with acute mental status change and metabolic encephalopathy. He also developed E. coli urinary tract infection, dehydration and hyponatremia. The patient has spastic paraplegia. He also is status post left above-knee amputation, peripheral neuropathy, type 2 diabetes mellitus, prior history of decubitus ulcers, chronic diastolic dysfunction, chronic obstructive pulmonary disease home oxygen dependent on 2  liters and uses CPAP at home, systemic hypertension, history of glaucoma and left eye blindness.   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   SOCIAL HABITS: Unobtainable due to the patient's delirium, but according to the medical records, he has history of tobacco abuse. I do not have information about his habit of alcohol or other drugs.   SOCIAL HISTORY: He is married, living with his wife. He is on disability. He usually uses a wheelchair for ambulation.   FAMILY HISTORY: Unobtainable due to the patient's delirium.   ADMISSION MEDICATIONS: I could not verify that, but last admission his medications were Ambien 10 mg, Ventolin HFA 4 times a day p.r.n., OxyContin 30 mg twice a day, metformin 500 mg twice a day, lorazepam 2 mg twice a day, hydrochlorothiazide with lisinopril 25/20 once a day, glimepiride or Amaryl 2 mg twice a day, gabapentin 300 mg 1 to 2 capsules daily, Advair Diskus 250/50 twice a day and oxycodone with acetaminophen 10/325 four times a day.   ALLERGIES: No known drug allergies.   PHYSICAL EXAMINATION:  VITAL SIGNS: Blood pressure 122/87, respiratory rate 16, pulse 87, temperature 98.2, oxygen saturation 94%.  GENERAL APPEARANCE: This is a middle-aged male lying in bed, restless, turning to the right and to the left, closing his eyes, in delirium state.  HEAD AND NECK: No pallor. No icterus. No cyanosis. Ear examination: Could not assess hearing due to his mental status change. No visible ulcers or discharge. Examination of the nose showed no discharge, no bleeding. Oropharyngeal examination is limited due to the patient's incooperation. Lips appear to be normal. Eye examination revealed  normal eyelids. The patient keeps closing his eyes. The left pupil is dilated and nonreactive to light, while to the right pupil is about 5 to 6 mm and reactive to light. Neck is supple. Trachea at midline. No  thyromegaly. No cervical lymphadenopathy.  HEART: Normal S1, S2. No S3, S4. No murmur. No gallop. No carotid bruits.  RESPIRATORY: Normal breathing pattern. No rales. No wheezing.  ABDOMEN: Soft, morbidly obese. No tenderness. No rigidity. No rebound. No organomegaly. There is circular, irregular border mass 4 inches in diameter located to the left side of the umbilicus slightly. It is movable. I am not sure about the nature of this mass, but it appears to be the same location of his baclofen pump.  MUSCULOSKELETAL: No joint swelling. No clubbing. He has left above-knee amputation.  SKIN: No ulcers. No subcutaneous nodules. Over the back area, I do not see any open ulcer; however, there is a stage I bedsore on the buttock area and above that just erythema of the skin. The area is soiled with fresh fecal material.  NEUROLOGIC: The patient is incoherent, restless, turning to the right and to the left. No facial asymmetry. He is moving his upper extremities.  PSYCHIATRY: The patient is disoriented and has acute delirium. Further evaluation is limited due to his altered mental status.   LABORATORY FINDINGS: His chest x-ray showed no acute cardiopulmonary abnormalities. ABG showed a pH of 7.36, pCO2 41, pO2 83. Salicylate level 6.9. Acetaminophen less than 2. Urinalysis showed 31 white blood cells, +3 leukocytes esterase. CBC showed white count of 12,000, hemoglobin 15, hematocrit 46, platelet count 314. Drug screen was negative. TSH 0.5. Liver function tests were fine except for slight elevation of AST at 60. Albumin 3.3. Ammonia level is elevated at 75. Ethanol level less than 3. Serum glucose 89, BUN 8, creatinine 0.6, sodium 139, potassium 3.4, calcium 9.7.   ASSESSMENT:  1. Acute delirium with metabolic encephalopathy.  2. Metabolic and respiratory alkalosis.  3. Urinary tract infection.  4. Hypokalemia.  5. Spastic paraplegia.  6. Diabetes mellitus, type 2.  7. Depression and questionable suicidal  attempt.  8. Chronic obstructive pulmonary disease, home oxygen dependent on 2 liters and uses CPAP as well.  9. His other medical problems include glaucoma and peripheral neuropathy.   PLAN: Will admit the patient. Neuro checks. IV antibiotic for his urinary tract infection. Will use Zosyn since the patient is in and out of the hospital pending results of the urine culture. Start CIWA protocol to control his agitation. IV fluid with potassium replacement. Accu-Cheks and sliding scale. Will hold his feeding and his oral medications since he is incoherent and it is unsafe to give him. Use CPAP and oxygen supplementation. The patient will need one-to-one sitter. He is already committed involuntarily, and psych will follow up. For deep vein thrombosis prophylaxis, will use Lovenox subcutaneously. For code status, I could not address this with the patient due to his altered mental status; however, according to the records last admission his wife reported that he is DO NOT RESUSCITATE; however, I could not clarify this with the wife. She just left from here and could not reach her. This needs to be further clarified with her in the morning.   Time spent in evaluating this patient took more than 1 hour, including reviewing his medical records.    ____________________________ Carney CornersAmir M. Rudene Rearwish, MD amd:gb D: 11/20/2012 02:42:16 ET T: 11/20/2012 03:49:09 ET JOB#: [id number]  cc: Carney CornersAmir M. Rudene Rearwish, MD, <Dictator> Celene Pippins  Dala Dock MD ELECTRONICALLY SIGNED 11/20/2012 6:42

## 2014-10-17 NOTE — Op Note (Signed)
PATIENT NAME:  Leonette NuttingMCMILLEN, Jeno W MR#:  [id number] DATE OF BIRTH:  1956-06-20  DATE OF PROCEDURE:  07/18/2012  PREOPERATIVE DIAGNOSES:   1.  Venous insufficiency, bilateral lower extremities, with venous ulceration of the left ankle.  2.  Chronic osteomyelitis, left ankle and shin.   POSTOPERATIVE DIAGNOSES:   1.  Venous insufficiency, bilateral lower extremities, with venous ulceration of the left ankle.  2.  Chronic osteomyelitis, left ankle and shin.   PROCEDURE PERFORMED:  Left above-knee amputation.   SURGEON:  Renford DillsGregory G. Schnier, MD   ANESTHESIA:  General by endotracheal intubation.   FLUIDS:  Per anesthesia record.   ESTIMATED BLOOD LOSS:  250 mL.   SPECIMEN:  Distal left limb to pathology for permanent section.   INDICATIONS:  The patient is a 59 year old gentleman who has been struggling for many months with chronic severe pain, osteomyelitis and infected ulcerations of the left leg. He was seen in the office and given the option of continued wound care versus amputation. The patient has elected for amputation. Risks and benefits were reviewed. All questions answered. The patient agrees to proceed.   DESCRIPTION OF PROCEDURE:  The patient is taken to the operating room and placed in the supine position. After adequate sedation is achieved, the left leg is prepped and draped in a circumferential fashion. Umbilical tape is then used to create a circumferential line around the knee approximately 1 handbreadth above the kneecap. Small angles are made at the medial and lateral midpoints to create a more formal fishmouth. A skin incision is then created and carried down through the soft tissues. Fascia is incised. Muscle bellies are then transected with Bovie cautery. Superficial femoral artery and vein are then individually ligated between 2-0 Ethibond and 2-0 silk sutures respectively. Periosteum is then elevated. Gigli saw is used to transect the shaft of the femur and the posterior  muscle bellies are transected with the amputation knife. Hemostats are used to control hemostasis and this is secured with 3-0 Vicryl. Nerve is identified and high ligation is performed to allow the stump of the nerve to retract into the muscle bellies. A rasp was used to smooth the femur and a liter of saline is used to irrigate the wound.   The fascia is then reapproximated using 0 Vicryl and the skin is reapproximated with staples. A bulky dressing is applied over Xeroform. The patient tolerated the procedure well and there were no immediate complications. Sponge and needle counts are correct and he was taken to the recovery area in stable condition.    ____________________________ Renford DillsGregory G. Schnier, MD ggs:si D: 07/18/2012 17:50:23 ET T: 07/19/2012 00:06:52 ET JOB#: [id number]  cc: Renford DillsGregory G. Schnier, MD, <Dictator> Lyndon CodeFozia M. Khan, MD  Renford DillsGREGORY G SCHNIER MD ELECTRONICALLY SIGNED 07/20/2012 10:04

## 2014-10-17 NOTE — Discharge Summary (Signed)
PATIENT NAME:  [name], [name] MR#:  [id number] DATE OF BIRTH:  01-03-1956  DATE OF ADMISSION:  10/18/2012 DATE OF DISCHARGE:    ADMISSION DIAGNOSIS: Altered mental status and confusion.   DISCHARGE DIAGNOSES: 1.  Altered mental status due to metabolic encephalopathy.  2.  Escherichia coli urinary tract infection.  3.  Severe dehydration.  4.  Hyponatremia due to poor p.o. intake and intravascular volume depletion.  5.  Dehydration.  6.  Hypokalemia.  7.  Hypomagnesemia.  8.  Spasticity and spastic paralysis.  9.  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  10.  Chronic pain.  11.  Peripheral neuropathy.  12.  Type 2 diabetes.  13.  Chronic decubitus ulcers.  14.  Right lower lobe atelectasis.  15.  History of chronic diastolic dysfunction.    DISPOSITION: Home with hospice.   FOLLOWUP:   1.  Primary care physician, , in 1 to 2 weeks.  2.  Follow up with hospice.  3.  Hospice to provide hospital bed.  4.  Bedside commode.  5.  CPAP at home with home oxygen. This is something that he already has.   IMPORTANT LAB WORK ON ADMISSION: His sodium was 118, potassium was 2.6, chloride 76, glucose 105, calcium 7.5, phosphorous 2.1, magnesium 1.5, alkaline phosphatase 184, AST 111, albumin 2.7. Cardiac enzymes have been negative. White blood count on admission 12.4, increased to 16.3, at discharge 7.2; hemoglobin 14.6 on admission, 12.7 at discharge, platelets 316 at discharge. Urinalysis showed more than 4000 CFU of Escherichia coli sensitive to multiple antibiotics, resistant only to ampicillin. UA showed 3 red blood cells. ABG showed a pH of 7.48, pCO2 of 74. EKG normal sinus rhythm, left anterior fascicular block.   HOSPITAL COURSE: The patient is a 59 year old gentleman with multiple comorbidities including severe peripheral vascular disease, COPD on 2 liters of oxygen at home and a CPAP at home, hypertension, diabetes, chronic diastolic CHF compensated, history of  spastic paralysis, wheelchair-bound on baclofen pump, history of glaucoma. The patient presented to the ER on 10/18/2012 with a diagnosis of altered mental status and confusion. The history was given by the wife, who is a very poor historian. The patient was lethargic and unable to talk. [facility] has been [facility] and left AMA as he was told he only had a few weeks to live.  He was set up with hospice but the patient did not get that arranged as he left AMA.   The patient was seen on the 19th in the Emergency Room with shortness of breath but he did not want to be admitted. Then he came back again with encephalopathy and confusion. He was started on a BiPAP in the Emergency Room and then he was very agitated for what the BiPAP was removed. His ABGs showed increase CO2 in the 70s. At that moment he was admitted for continuation of his treatment.   PROBLEMS: 1.  Acute encephalopathy, altered mental status, metabolic encephalopathy, multifactorial due to pain medications. The patient on combination of baclofen and OxyContin. He was taken off pain medications and the patient is still having significant amount of pain. Right now, we are getting him back to his home dose medications so the patient can be discharged. He actually is going to be discharged with hospice care who are going to provide a hospital bed and significant assistance on his care. We are also going to give him a Nurse, adultHoyer lift. The patient cleared up after a  day and this is likely multifactorial based on his history of urinary tract infection and use of pain medications. The patient did not have any signs of aspiration. He actually was severely dehydrated. IV fluids were given and significant improvement was achieved back to the baseline.  2.  Hyponatremia, hypokalemia and hypomagnesemia. This is all due to poor oral intake and dehydration. The patient was also intravascular volume depleted for what IV fluids were given. The patient  has also significant improvement and after IV fluids he started breaking out.  3.  Chronic respiratory failure. The patient has history of decreased oxygen saturation. He uses oxygen at home, 2 liters, and a CPAP. He only uses the oxygen at night. Continue same management.  4.  Elevated white blood count related to urine infection. The patient was treated with Rocephin first, then switched to Levaquin. The recent change to Levaquin was because he was having significant sputum and cough and he is at risk of pneumonia, although the chest x-ray did not corroborate or did not prove to be pneumonia. There is a possibility of pneumonitis although most likely this is just a sepsis related to the urinary tract infection. Recommended to the patient that if he started coughing or having more fevers to follow up with his primary care physician. Levaquin is going to be dosed as 500 mg.  5.  Elevated white count. This is reactive to the infection. The patient was started on Rocephin and then switched to Levaquin.  6.  Type 2 diabetes.  The patient had an insulin sliding scale and glimepiride and metformin. His medications were held in the beginning and then restarted slowly. Today, he has hypoglycemia on glimepiride because he was not eating.  He skipped breakfast and then had it late.  Recommendations about when to take his medications in reference to his meals have been given to the patient.  7.  Spastic paralysis. Continue baclofen pump.  8.  Multiple decubitus ulcers. The patient has this as a chronic issue. He has not been cleaning himself very well.  At this moment, consideration will be to put a Foley catheter but the patient had a bad urine infection. I will recommend to stay off the catheter indefinitely.  9.  The patient to be discharged to home hospice today.  They are going to give him wound care as well.  10.  Other medical problems are stable.   TIME SPENT: I spent about 55 minutes with this patient  today. The case was discussed at length with social work.   ____________________________ Felipa Furnace, MD rsg:cs D: 10/21/2012 14:02:00 ET T: 10/21/2012 15:10:37 ET JOB#: [id number]  cc: Felipa Furnace, MD, <Dictator> Kairyn Olmeda Juanda Chance MD ELECTRONICALLY SIGNED 10/27/2012 11:00

## 2014-10-17 NOTE — Consult Note (Signed)
PATIENT NAME:  [name], [name] MR#:  [id number] DATE OF BIRTH:  1955/11/15  DATE OF CONSULTATION:  07/18/2012  REFERRING PHYSICIAN:  Renford DillsGregory G. Schnier, MD CONSULTING PHYSICIAN:  Lajuan Godbee P. Juliene PinaMody, MD  PRIMARY CARE PHYSICIAN:  Lyndon CodeFozia M. Khan, MD  REASON FOR REQUEST:  Postop left AKA medical management.   IMPRESSION:   1.  Postoperative left above-knee amputation for osteomyelitis and chronic wounds.  2.  Postoperative hypoxia with a history of chronic obstructive pulmonary disease.  3.  History of diabetes.  4.  History of hypertension.  5.  History of spastic paralysis with a baclofen pump.  6.  Smoking dependence.   PLAN:   1.  We will continue all outpatient medications with the exception of metformin, can increase glipizide and add sliding scale insulin.  2.  For the hypoxia, we would order a chest x-ray. It does not sound like the patient has COPD exacerbation at this time. He does not have any wheezing. We would continue to monitor.  3.  Follow labs while in house.  4.  The patient is not interested in quitting smoking at this time. I have placed a nicotine patch.   HISTORY OF PRESENT ILLNESS:  This is a very pleasant 59 year old male with spastic paralysis who is wheelchair bound and has a baclofen pump, history of chronic osteomyelitis of the left leg status post left AKA today by Dr. Gilda CreaseSchnier. The hospitalist staff was consulted for medical management. The patient has no complaints today and is doing quite well.   REVIEW OF SYSTEMS:   CONSTITUTIONAL: No fever. He has generalized fatigue and weakness. He is wheelchair bound.  EYES: No blurred or double vision.  ENT: No ear pain, hearing loss or seasonal allergies. Positive snoring.  RESPIRATORY: No cough, wheezing, or hemoptysis. Positive COPD, not on oxygen.  CARDIOVASCULAR: Denies any chest pain, orthopnea, edema, arrhythmia, dyspnea on exertion.  GASTROINTESTINAL: No nausea, vomiting, diarrhea, abdominal pain, melena or  ulcers.  GENITOURINARY: He says he has chronic issues with urinating, but no dysuria, frequency or urgency.  ENDOCRINE: No polyuria or polydipsia.  HEMATOLOGY AND LYMPHATICS: No easy bruising.  SKIN: No rashes or lesions.  MUSCULOSKELETAL: He has a left AKA and limited activity due to spastic paralysis.  NEUROLOGIC: No history of CVA, TIA or seizures.  PSYCHIATRIC: No history of anxiety or depression.   PAST MEDICAL HISTORY:   1.  Hypertension.  2.  Diabetes.  3.  History of COPD.  4.  History of CHF.  5.  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  6.  History of glaucoma.   MEDICATIONS:   1.  Advair Diskus 250/50 mcg b.i.d.  2.  Gabapentin 300 mg t.i.d.  3.  Glimepiride 1 mg b.i.d.  4.  Lisinopril/HCTZ 20/25 mg daily.  5.  Lorazepam 2 mg b.i.d.  6.  Metformin 500 mg b.i.d.  7.  OxyContin 30 mg daily.  8.  Percocet 10/325 mg 1 tablet b.i.d.   ALLERGIES:  No known drug allergies.   FAMILY HISTORY:  Positive for throat cancer.   PAST SURGICAL HISTORY:  He has an infusion pump in the left abdomen of baclofen.   SOCIAL HISTORY:  The patient smokes over 2 to 2-1/2 packs a day and is not interested in quitting. No IV drug use or alcohol.   PHYSICAL EXAMINATION: VITAL SIGNS: The patient is afebrile with temperature 98.3, blood pressure 110/77, respiratory rate 18 and 96% on 4 liters.  GENERAL:  The patient is alert and oriented not in acute distress.  HEENT: Head is atraumatic. Pupils are round and reactive. Sclerae are anicteric. Mucous membranes are moist. Oropharynx is clear.  NECK: Supple without JVD. It is hard to appreciate thyromegaly due to the short neck.  CARDIOVASCULAR: Regular rate and rhythm. No murmurs, gallops or rubs. It is hard to palpate PMI due to body habitus.  ABDOMEN: Obese. Bowel sounds are positive. It is hard to palpate organomegaly due to body habitus.  EXTREMITIES: He has a left AKA. The right has 1+ edema, pitting.   NEUROLOGIC: Cranial nerves II through XII are grossly intact. There are no focal deficits.  LUNGS: Clear to auscultation without crackles, rales, rhonchi or wheezing. Normal percussion.  SKIN: Without rash or lesions. He does have a baclofen pump on the left lower abdomen located subcutaneously.   LABORATORY DATA:  From July 17, 2012, white blood cells 14.4, hemoglobin 15.2, hematocrit 45, platelets 334. Sodium is 138, potassium 4.2, chloride 106, bicarbonate 25, BUN 14, creatinine 0.83, glucose 83.   Thank you for allowing [place] to participate in the care of this patient. We will continue to follow.   TIME SPENT:  Approximately 45 minutes on this consult.    ____________________________ Stevi Hollinshead P. Juliene Pina, MD spm:si D: 07/18/2012 19:14:00 ET T: 07/18/2012 20:54:36 ET JOB#: [id number]  cc: Rashee Marschall P. Juliene Pina, MD, <Dictator> Lyndon Code, MD Renford Dills, MD  Janyth Contes Azhar Yogi MD ELECTRONICALLY SIGNED 07/19/2012 13:53

## 2014-10-17 NOTE — Consult Note (Signed)
NKDA: None    Impression 1. s/p left AKA 2. hx DM 3. COPD 4. hx spastic paralysisi wheelchair bound 5. HTn 6. smoker 7. hypoxia after surgery    Plan 1. cont outpt meds hold metformin 2. SSI 3. chest XRAy 4. no evidence of COPD exacerbation, wean O2 as toelrated and monitor   thank you will follow   Electronic Signatures: Adrian SaranMody, Lamara Brecht (MD)  (Signed 22-Jan-14 19:03)  Authored: Allergies, Impression/Plan   Last Updated: 22-Jan-14 19:03 by Adrian SaranMody, Kechia Yahnke (MD)

## 2014-10-18 NOTE — Consult Note (Signed)
   Comments   Pt appears comfortable. Bed available at [facility] and wife requests transfer. Orders entered.  Electronic Signatures: Treasa Bradshaw, Harriett SineNancy (MD)  (Signed 09-Jan-15 13:01)  Authored: Palliative Care   Last Updated: 09-Jan-15 13:01 by Janelle Spellman, Harriett SineNancy (MD)

## 2014-10-18 NOTE — H&P (Signed)
PATIENT NAME:  [name], [name] MR#:  [id number] DATE OF BIRTH:  09/23/1955  DATE OF ADMISSION:  07/03/2013  PRIMARY CARE PHYSICIAN: Lyndon Code, MD  REFERRING ER PHYSICIAN: Randon Goldsmith. Lord, MD  CHIEF COMPLAINT: Shortness of breath.   HISTORY OF PRESENT ILLNESS: This is a 59 year old male who has past medical history of COPD, on 2 liters of oxygen at home but noncompliant, type 2 diabetes, has paraplegia and left above-knee amputation who is wheelchair-bound. Had pneumonia last week and was on [facility] admitted but, as per the wife, he was more alert and getting IV antibiotics. He felt a little better and signed himself out the next day from there and came home.  He continued to feel short of breath and so went to PMD and she prescribed antibiotic again which was 3 days ago. He was taking it since then but did not improve and felt worse so finally called EMS today and when brought over here he was extremely short of breath and respiratory distress so intubated in the ER and started on ventilator for further management and hospitalist service is contacted. On further questioning to wife, who is the main source of information, right now she says that he did not have any fever but he had cough which was producing some clear sputum.   REVIEW OF SYSTEMS: Unable to get it as the patient is intubated currently.   PAST MEDICAL HISTORY: 1.  Recent admission to [facility] with pneumonia last week.  2.  History of E. coli UTI.   3.  Dehydration and hyponatremia.  4.  Spastic paraplegia.  5.  Left above-knee amputation.  6.  Peripheral neuropathy.  7.  Type 2 diabetes mellitus.  8.  History of decubitus ulcer.  9.  Chronic diastolic dysfunction.  10.  Chronic obstructive pulmonary disease, on home oxygen 2 liters but not very compliant.  11.  CPAP at night.  12.  History of hypertension.  13.  History of glaucoma and left eye blindness.  14.  Spastic paraplegia and some kind of pump for  muscle relaxant.   PAST SURGICAL HISTORY:  1.  Left below-knee amputation in January 2014.  2.  Baclofen pump placement in the left lower abdomen.   SOCIAL HABITS: He is a smoker, 2 packs per day. Denies alcohol and no illegal drug use as per wife. Lives at home using wheelchair and disabled because of his leg issues.   FAMILY HISTORY: Brother had a stroke.   HOME MEDICATION:  1.  Zolpidem 10 mg oral tablet once a day.  2.  Victoza 80 mg 1.8 mL subcutaneous once a day.  3.  Levofloxacin 1 tablet once a day for 5 days.  4.  Hydrochlorothiazide and lisinopril 25/20 mg oral tablet once a day.  5.  Haldol 1 mg oral tablet take 1-1/2 tablets orally 4 times a day.  6.  Glimepiride 1 mg oral tablet 2 times a day.  7.  Gabapentin 300 mg oral tablet 4 times a day.  8.  Diltiazem 180 mg 24-hour capsule 2 capsules once a day.  9.  Clonazepam 0.5 mg oral tablet 4 times a day.  10.  Albuterol 2 puffs inhalation every 6 hours.  11.  Advair Disk 2 puffs inhalation 2 times a day.  12.  Acetaminophen and oxycodone 10 mg tablet 4 times a day.   PHYSICAL EXAMINATION: VITAL SIGNS: In the ER, temperature 98.7, pulse 113, respirations 26, blood pressure to 208/87 and  pulse ox 96% on ventilator.  GENERAL: The patient is currently sedated, on the ventilator support, endotracheal tube in place. HEENT: Head and neck atraumatic. Conjunctiva pink. Oral mucosa moist. ET tube in place. NECK: Supple. No JVD. There is an external jugular peripheral line present on the right side of the neck.  CHEST: Bilateral equal air entry. Some crepitation present.  CARDIOVASCULAR: S1, S2 present, regular. No murmur.  ABDOMEN: That a scar of surgery present and some instrument is felt on the right lower abdomen underneath the skin. Bowel sounds present. No organomegaly felt.  SKIN: No rashes.  LEGS: Left side above-knee amputation, right side no edema.  NEUROLOGICAL: Currently sedated and on ventilator so not able to get it but  he does not have any response to stimuli because of sedation. No rigidity.   LABORATORY RESULTS: Blood glucose 51 on arrival, currently 114; BUN 20, creatinine 1.49, sodium 136, potassium 3.8, chloride 107, CO2 25. Ethanol level less than 3. Urine for toxicology is positive for opiate. WBC count 22.7, hemoglobin 15.1, platelet count 365, MCV 85. Urinalysis is grossly positive with 25 WBC and 3+ leukocyte esterase. ABG: PH 7.19, pCO2 49 and pO2 is 136. This is on assist control mode on the ventilator with 100% FiO2, 5 of PEEP and mechanical breath of 12, 500 tidal volume.   ASSESSMENT AND PLAN: A 59 year old male who has multiple medical problems in the past. He is wheelchair-bound because of his paraplegia and had pneumonia last week, admitted  to [facility] and signed himself out and was on oral antibiotic but continued to get worse and came in with acute respiratory distress, intubated in the ER.  1.  Acute on chronic respiratory failure secondary to pneumonia. We will continue vent management and treat underlying cause for pneumonia. Pulmonary consult to manage ventilator.  2.  Healthcare-associated pneumonia. As the patient was admitted to [facility] last week and then continued to get worse with pneumonia, will treat him with vancomycin, Zosyn and azithromycin here.  Influenza A and B are to be collected and sent for further identification. Blood cultures have been sent.  3.  Urinary tract infection. He is on Zosyn for that and urine culture is in process for further identification of the organism.  4.  Chronic obstructive pulmonary disease, chronic respiratory failure, on home oxygen 2 liters, noncompliant. Will give him IV steroid as he has respiratory failure here and we will give him nebulizer treatment, bronchodilatation while we are treating for pneumonia as well.  5.  Diabetes. Will hold oral medication and will give insulin sliding scale coverage  6.  Spastic paraplegia, status post baclofen  pump placement in his lower abdomen. Currently stable. Continue monitoring.   Condition is critical, overall prognosis is very poor. Patient was DO NOT RESUSCITATE in the past but, as per the wife, he revoked it and now his wishes is not to be on machine if he is brain dead or he has to go for long term on machines so we will respect those wishes. Condition explained to patient's wife about very difficult situation and high risk for cardiac arrest. She understands and agrees to do everything to survive him.   CRITICAL CARE TIME SPENT: 60 minutes on this admission.   ____________________________ Hope PigeonVaibhavkumar G. Elisabeth PigeonVachhani, MD vgv:cs D: 07/03/2013 19:07:53 ET T: 07/03/2013 19:40:30 ET JOB#: [id number]  cc: Hope PigeonVaibhavkumar G. Elisabeth PigeonVachhani, MD, <Dictator> Lyndon CodeFozia M. Khan, MD Heath GoldVAIBHAVKUMAR [facility] MD ELECTRONICALLY SIGNED 07/03/2013 22:22 Altamese DillingVAIBHAVKUMAR Lennox Leikam MD ELECTRONICALLY SIGNED 07/03/2013 22:25

## 2014-10-18 NOTE — Op Note (Signed)
PATIENT NAME:  [name], [name] MR#:  [id number] DATE OF BIRTH:  [birth year]  DATE OF PROCEDURE:  07/03/2013  PREOPERATIVE DIAGNOSIS: Unresponsive.   POSTOPERATIVE DIAGNOSIS:  Unresponsive.    PROCEDURE: Right femoral vein central line placement.   SURGEON: [name] Enderle E. Excell Seltzerooper, M.D.   ANESTHESIA: Local anesthetic.   INDICATIONS: This patient, who is unresponsive, has been intubated. Dr. Glenetta HewMcLaurin has asked me to place a central line. She has attempted multiple times in the neck and failed, has a right EJ  catheter in place but has not been able to place a central line. Therefore, I chose the right femoral line site as an alternative. No consent could be obtained, no family present.   FINDINGS: Central line placement in the right femoral vein without difficulty.   DESCRIPTION OF PROCEDURE: The patient was identified in the ER, prepped and draped in a sterile fashion. Everyone in the room wore a cap and mask, and I wore a cap, mass, gown and gloves. Local anesthetic was infiltrated in skin and subcutaneous tissues after prepping and draping the patient aseptically. Palpation of the right femoral pulse allowed for easy access and entry with a large-bore catheter into the right femoral vein. A Seldinger wire was placed. An incision was made. The introducer dilator was placed followed by the previously-flushed triple-lumen catheter. The wire was removed. All lumens were aspirated and flushed, and the catheter was sutured to the skin of the thigh and a sterile dressing was placed. The patient tolerated the procedure well. There were no complications. He was left in the ICU in critical condition.    ____________________________ Adah Salvageichard E. Excell Seltzerooper, MD rec:cs D: 07/03/2013 17:21:00 ET T: 07/03/2013 20:45:59 ET JOB#: [id number]  cc: Adah Salvageichard E. Excell Seltzerooper, MD, <Dictator> Lattie HawICHARD E Irean Kendricks MD ELECTRONICALLY SIGNED 07/05/2013 9:57

## 2014-10-18 NOTE — Consult Note (Signed)
   Comments   I met with pt's wife. She says that both she and pt know what poor health he is in and already have his funeral planned. Pt has told his wife a number of time, including several days ago, that he wanted to die at the [facility]. She does not think he would want to be on vent long term and would not want to be on vent if there was "no hope". Wife wants to start considering extubation to comfort care.  spoke with pt's RN from hospice. She says that pt had told her that he wanted to be resuscitated in the event of cardiac arrest but she never asked him about intubation.  follow up with wife in AM.   Electronic Signatures: Louanne Calvillo, Izora Gala (MD)  (Signed 08-Jan-15 16:06)  Authored: Palliative Care   Last Updated: 08-Jan-15 16:06 by Amanpreet Delmont, Izora Gala (MD)

## 2014-10-18 NOTE — Consult Note (Signed)
   Comments   Pt's wife asked that I come back to speak with a friend of pt's, Nilda CalamityLinda Whitlow. Ms Maude LericheWhitlow worked with pt and has known him many years. She says that she and pt have had many conversations during which pt said he did not want to be intubated. Both friend and wife are in agreement with extubation to comfort care. Will meet with them in AM.   Electronic Signatures: Anida Deol, Harriett SineNancy (MD)  (Signed 08-Jan-15 17:25)  Authored: Palliative Care   Last Updated: 08-Jan-15 17:25 by Idrees Quam, Harriett SineNancy (MD)

## 2014-10-18 NOTE — Discharge Summary (Signed)
PATIENT NAME:  [name], [name] MR#:  [id number] DATE OF BIRTH:  September 28, 1955  DATE OF ADMISSION:  07/03/2013 DATE OF DISCHARGE:  07/05/2013  ADMITTING PHYSICIAN: Dr. Elisabeth Pigeon.   DISCHARGING PHYSICIAN: Enid Baas.  PRIMARY CARE PHYSICIAN: Beverely Risen.   CONSULTATIONS IN THE HOSPITAL:  1.  Palliative care consultation by Dr. Harriett Sine Phifer.  2.  Pulmonary critical care consultation by Dr. Freda Munro.  3.  Surgical consultation by Dr. Excell Seltzer for central line placement.   DISCHARGE DIAGNOSES: 1.  Sepsis.  2.  Acute respiratory failure due to healthcare-acquired pneumonia.  3.  Right lower lobe healthcare-acquired pneumonia.  4.  Septic shock.  5.  Chronic obstructive pulmonary disease exacerbation, acute on chronic.  6.  Acute renal failure.  7.  Toxic metabolic encephalopathy.  8.  Insulin-dependent diabetes mellitus.  9.  Spastic paraparesis.  10.  The patient is status post left above knee amputation, on a baclofen pump.  11.  Transverse myelitis.  12.  Peripheral neuropathy.  13.  Peripheral vascular disease.  14.  Diastolic dysfunction with history of congestive heart failure.  15.  History of decubitus ulcers.  16.  Glaucoma with left eye blindness.  17.  Ongoing smoking, about 2 packs per day.   DISCHARGE HOME MEDICATIONS:  1.  Ativan 0.5 mg 1 to 2 tablets orally every 2 to 4 hours p.r.n. for anxiety.  2.  Morphine 20 mg/mL oral concentrate 0.5 mL every 1 to 2 hours p.r.n. for pain.  3.  Zofran oral disintegrating tablet 4 mg q.6 hours p.r.n. for nausea and vomiting.   Oxygen is 2 liters.     DISCHARGE FOLLOWUP INSTRUCTIONS: The patient is being transferred to hospice home.   LABS AND IMAGING STUDIES PRIOR TO DISCHARGE: WBC 19.8, hemoglobin 12.9, hematocrit 37.8, platelet count 317.   Sodium 139, potassium 3.1, chloride 108, bicarb 28, BUN 15, creatinine 0.84, glucose of 152 and calcium of 9.3. ABG: Last ABG with pH of 7.27, pCO2 of 45, pO2 of 52, bicarb of 20.7  and sats of 99% on 50% FiO2. Influenza test is negative. Blood culture is positive for Staph  epidermidis and also Enterococcus faecalis. Urinalysis positive for an infection. Cultures growing mixed organisms.   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 The patient was recently in the hospital at [facility] for pneumonia, but left AMA and brought back.   1.  Acute respiratory failure secondary to chronic obstructive pulmonary disease exacerbation and pneumonia. The patient was intubated in the hospital, remained on vent, was requiring higher amounts of FiO2. Blood cultures were growing Staph epidermidis  and Enterococcus faecalis. The patient was hypotensive and septic, requiring Levophed, and he was on broad-spectrum antibiotics. He was not showing any improvement, and with his multiple medical problems, palliative care was consulted. The patient's wife has expressed that the patient wanted to be DO NOT RESUSCITATE, and he was already followed by hospice nurse. After discussing with palliative care, the patient was terminally extubated with no plans to reintubated on 07/05/2013, and was made comfort care. He was discharged to hospice home for further care.   DISCHARGE CONDITION: Guarded with extremely poor prognosis.  DISCHARGE DISPOSITION: Hospice home.   TIME SPENT ON DISCHARGE: 45 minutes.   ____________________________ Enid Baasadhika Avya Flavell, MD rk:dmm D: 07/09/2013 11:25:29 ET T: 07/09/2013 11:47:32 ET JOB#: [id number]  cc: Enid Baasadhika Jamarion [place], MD, <Dictator> Enid BaasADHIKA  Christorpher Hisaw MD ELECTRONICALLY SIGNED 07/09/2013 14:27
